# Patient Record
Sex: Female | Born: 1985 | Race: White | Hispanic: No | Marital: Married | Smoking: Current every day smoker
Health system: Southern US, Community
[De-identification: ages and names within clinical notes are randomized; demographics above are authoritative.]

## PROBLEM LIST (undated history)

## (undated) DIAGNOSIS — N200 Calculus of kidney: Secondary | ICD-10-CM

## (undated) DIAGNOSIS — J45909 Unspecified asthma, uncomplicated: Secondary | ICD-10-CM

## (undated) HISTORY — PX: BLADDER SURGERY: SHX569

## (undated) HISTORY — PX: ABDOMINAL HYSTERECTOMY: SHX81

## (undated) HISTORY — PX: BACK SURGERY: SHX140

---

## 2007-03-20 ENCOUNTER — Ambulatory Visit (HOSPITAL_COMMUNITY): Admission: RE | Admit: 2007-03-20 | Discharge: 2007-03-20 | Payer: Self-pay | Admitting: Obstetrics and Gynecology

## 2007-04-09 ENCOUNTER — Ambulatory Visit (HOSPITAL_COMMUNITY): Admission: RE | Admit: 2007-04-09 | Discharge: 2007-04-09 | Payer: Self-pay | Admitting: Obstetrics and Gynecology

## 2007-05-07 ENCOUNTER — Ambulatory Visit (HOSPITAL_COMMUNITY): Admission: RE | Admit: 2007-05-07 | Discharge: 2007-05-07 | Payer: Self-pay | Admitting: Obstetrics and Gynecology

## 2007-06-04 ENCOUNTER — Ambulatory Visit (HOSPITAL_COMMUNITY): Admission: RE | Admit: 2007-06-04 | Discharge: 2007-06-04 | Payer: Self-pay | Admitting: Obstetrics and Gynecology

## 2007-07-02 ENCOUNTER — Ambulatory Visit (HOSPITAL_COMMUNITY): Admission: RE | Admit: 2007-07-02 | Discharge: 2007-07-02 | Payer: Self-pay | Admitting: Obstetrics and Gynecology

## 2007-07-30 ENCOUNTER — Ambulatory Visit (HOSPITAL_COMMUNITY): Admission: RE | Admit: 2007-07-30 | Discharge: 2007-07-30 | Payer: Self-pay | Admitting: Obstetrics and Gynecology

## 2007-08-24 ENCOUNTER — Ambulatory Visit (HOSPITAL_COMMUNITY): Admission: RE | Admit: 2007-08-24 | Discharge: 2007-08-24 | Payer: Self-pay | Admitting: Obstetrics and Gynecology

## 2014-04-03 ENCOUNTER — Encounter (HOSPITAL_COMMUNITY): Payer: Self-pay | Admitting: Emergency Medicine

## 2014-04-03 ENCOUNTER — Emergency Department (HOSPITAL_COMMUNITY)
Admission: EM | Admit: 2014-04-03 | Discharge: 2014-04-04 | Disposition: A | Discharge status: Home / Self Care | Payer: Self-pay | Attending: Emergency Medicine | Admitting: Emergency Medicine

## 2014-04-03 DIAGNOSIS — N390 Urinary tract infection, site not specified: Secondary | ICD-10-CM | POA: Insufficient documentation

## 2014-04-03 DIAGNOSIS — J45909 Unspecified asthma, uncomplicated: Secondary | ICD-10-CM | POA: Insufficient documentation

## 2014-04-03 DIAGNOSIS — R1031 Right lower quadrant pain: Secondary | ICD-10-CM | POA: Insufficient documentation

## 2014-04-03 DIAGNOSIS — F17200 Nicotine dependence, unspecified, uncomplicated: Secondary | ICD-10-CM | POA: Insufficient documentation

## 2014-04-03 DIAGNOSIS — Z3202 Encounter for pregnancy test, result negative: Secondary | ICD-10-CM | POA: Insufficient documentation

## 2014-04-03 HISTORY — DX: Unspecified asthma, uncomplicated: J45.909

## 2014-04-03 LAB — URINALYSIS, ROUTINE W REFLEX MICROSCOPIC
Bilirubin Urine: NEGATIVE
Glucose, UA: NEGATIVE mg/dL
Hgb urine dipstick: NEGATIVE
Ketones, ur: NEGATIVE mg/dL
LEUKOCYTES UA: NEGATIVE
NITRITE: POSITIVE — AB
PROTEIN: NEGATIVE mg/dL
Specific Gravity, Urine: 1.015 (ref 1.005–1.030)
Urobilinogen, UA: 1 mg/dL (ref 0.0–1.0)
pH: 5.5 (ref 5.0–8.0)

## 2014-04-03 LAB — COMPREHENSIVE METABOLIC PANEL
ALBUMIN: 3.7 g/dL (ref 3.5–5.2)
ALK PHOS: 93 U/L (ref 39–117)
ALT: 9 U/L (ref 0–35)
AST: 13 U/L (ref 0–37)
Anion gap: 12 (ref 5–15)
BUN: 9 mg/dL (ref 6–23)
CHLORIDE: 106 meq/L (ref 96–112)
CO2: 24 mEq/L (ref 19–32)
Calcium: 8.9 mg/dL (ref 8.4–10.5)
Creatinine, Ser: 0.66 mg/dL (ref 0.50–1.10)
GLUCOSE: 96 mg/dL (ref 70–99)
POTASSIUM: 3.8 meq/L (ref 3.7–5.3)
Sodium: 142 mEq/L (ref 137–147)
TOTAL PROTEIN: 6.7 g/dL (ref 6.0–8.3)

## 2014-04-03 LAB — URINE MICROSCOPIC-ADD ON

## 2014-04-03 LAB — CBC WITH DIFFERENTIAL/PLATELET
BASOS PCT: 0 % (ref 0–1)
Basophils Absolute: 0 10*3/uL (ref 0.0–0.1)
EOS PCT: 3 % (ref 0–5)
Eosinophils Absolute: 0.3 10*3/uL (ref 0.0–0.7)
HCT: 36.3 % (ref 36.0–46.0)
Hemoglobin: 11.9 g/dL — ABNORMAL LOW (ref 12.0–15.0)
Lymphocytes Relative: 38 % (ref 12–46)
Lymphs Abs: 2.9 10*3/uL (ref 0.7–4.0)
MCH: 28.4 pg (ref 26.0–34.0)
MCHC: 32.8 g/dL (ref 30.0–36.0)
MCV: 86.6 fL (ref 78.0–100.0)
MONO ABS: 0.6 10*3/uL (ref 0.1–1.0)
Monocytes Relative: 8 % (ref 3–12)
Neutro Abs: 3.8 10*3/uL (ref 1.7–7.7)
Neutrophils Relative %: 51 % (ref 43–77)
PLATELETS: 217 10*3/uL (ref 150–400)
RBC: 4.19 MIL/uL (ref 3.87–5.11)
RDW: 13.2 % (ref 11.5–15.5)
WBC: 7.5 10*3/uL (ref 4.0–10.5)

## 2014-04-03 LAB — POC URINE PREG, ED: PREG TEST UR: NEGATIVE

## 2014-04-03 NOTE — ED Provider Notes (Signed)
CSN: [id number]     Arrival date & time 04/03/14  2107 History   First MD Initiated Contact with Patient 04/03/14 2354     Chief Complaint  Patient presents with  . Possible Pregnancy  . Abdominal Pain     (Consider location/radiation/quality/duration/timing/severity/associated sxs/prior Treatment) HPI Patient is a 28 yo woman who presents with complaints of intermittent RLQ pain x 2 months. She has been nauseated but, denies vomiting. No fever. No diarrhea. Normal BMs, + Dysuria. No vaginal discharge. Nothing makes sx worse or better. Patient was seen at [facility] a couple of weeks ago and had a CT abd/pelvis which, she was told was normal.   Pain is aching, nonradiating.    Past Medical History  Diagnosis Date  . Asthma    Past Surgical History  Procedure Laterality Date  . Back surgery     No family history on file. History  Substance Use Topics  . Smoking status: Current Every Day Smoker -- 0.50 packs/day  . Smokeless tobacco: Not on file  . Alcohol Use: No   OB History   Grav Para Term Preterm Abortions TAB SAB Ect Mult Living                 Review of Systems  10 point review of symptoms obtained and is negative with the exceptions of symptoms noted above     Allergies  Review of patient's allergies indicates no known allergies.  Home Medications   Prior to Admission medications   Not on File   BP 102/72  Pulse 74  Temp(Src) 98.1 F (36.7 C)  Resp 20  Ht 5\' 2"  (1.575 m)  Wt 140 lb (63.504 kg)  BMI 25.60 kg/m2  SpO2 100%  LMP 02/01/2014 Physical Exam  Gen: well nourished and well developed appearing Head: NCAT Ears: normal to inspection Nose: normal to inspection, no epistaxis or drainage Mouth: oral mucsoa is well hydrated appearing, normal posterior oropharynx Neck: supple, no stridor CV: RRR, no murmur, palpable peripheral pulses Resp: lung sounds are clear to auscultation bilaterally, no wheeing or rhonchi or rales, normal  respiratory effort.  Abd: soft, mild RLQ ttp, nondistended Extremities: normal to inspection.  Skin: warm and dry Neuro: CN ii - XII, no focal deficitis Psyche; normal affect, cooperative.   ED Course  Procedures (including critical care time) Labs Review  Results for orders placed during the hospital encounter of 04/03/14 (from the past 24 hour(s))  URINALYSIS, ROUTINE W REFLEX MICROSCOPIC     Status: Abnormal   Collection Time    04/03/14  9:54 PM      Result Value Ref Range   Color, Urine YELLOW  YELLOW   APPearance CLOUDY (*) CLEAR   Specific Gravity, Urine 1.015  1.005 - 1.030   pH 5.5  5.0 - 8.0   Glucose, UA NEGATIVE  NEGATIVE mg/dL   Hgb urine dipstick NEGATIVE  NEGATIVE   Bilirubin Urine NEGATIVE  NEGATIVE   Ketones, ur NEGATIVE  NEGATIVE mg/dL   Protein, ur NEGATIVE  NEGATIVE mg/dL   Urobilinogen, UA 1.0  0.0 - 1.0 mg/dL   Nitrite POSITIVE (*) NEGATIVE   Leukocytes, UA NEGATIVE  NEGATIVE  URINE MICROSCOPIC-ADD ON     Status: Abnormal   Collection Time    04/03/14  9:54 PM      Result Value Ref Range   Squamous Epithelial / LPF FEW (*) RARE   WBC, UA 3-6  <3 WBC/hpf   RBC / HPF 0-2  <  3 RBC/hpf   Bacteria, UA FEW (*) RARE   Urine-Other MUCOUS PRESENT    CBC WITH DIFFERENTIAL     Status: Abnormal   Collection Time    04/03/14 10:00 PM      Result Value Ref Range   WBC 7.5  4.0 - 10.5 K/uL   RBC 4.19  3.87 - 5.11 MIL/uL   Hemoglobin 11.9 (*) 12.0 - 15.0 g/dL   HCT 45.436.3  09.836.0 - 11.946.0 %   MCV 86.6  78.0 - 100.0 fL   MCH 28.4  26.0 - 34.0 pg   MCHC 32.8  30.0 - 36.0 g/dL   RDW 14.713.2  82.911.5 - 56.215.5 %   Platelets 217  150 - 400 K/uL   Neutrophils Relative % 51  43 - 77 %   Neutro Abs 3.8  1.7 - 7.7 K/uL   Lymphocytes Relative 38  12 - 46 %   Lymphs Abs 2.9  0.7 - 4.0 K/uL   Monocytes Relative 8  3 - 12 %   Monocytes Absolute 0.6  0.1 - 1.0 K/uL   Eosinophils Relative 3  0 - 5 %   Eosinophils Absolute 0.3  0.0 - 0.7 K/uL   Basophils Relative 0  0 - 1 %    Basophils Absolute 0.0  0.0 - 0.1 K/uL  COMPREHENSIVE METABOLIC PANEL     Status: Abnormal   Collection Time    04/03/14 10:00 PM      Result Value Ref Range   Sodium 142  137 - 147 mEq/L   Potassium 3.8  3.7 - 5.3 mEq/L   Chloride 106  96 - 112 mEq/L   CO2 24  19 - 32 mEq/L   Glucose, Bld 96  70 - 99 mg/dL   BUN 9  6 - 23 mg/dL   Creatinine, Ser 1.300.66  0.50 - 1.10 mg/dL   Calcium 8.9  8.4 - 86.510.5 mg/dL   Total Protein 6.7  6.0 - 8.3 g/dL   Albumin 3.7  3.5 - 5.2 g/dL   AST 13  0 - 37 U/L   ALT 9  0 - 35 U/L   Alkaline Phosphatase 93  39 - 117 U/L   Total Bilirubin <0.2 (*) 0.3 - 1.2 mg/dL   GFR calc non Af Amer >90  >90 mL/min   GFR calc Af Amer >90  >90 mL/min   Anion gap 12  5 - 15  POC URINE PREG, ED     Status: None   Collection Time    04/03/14 10:09 PM      Result Value Ref Range   Preg Test, Ur NEGATIVE  NEGATIVE     MDM   Patient noted to have UTI. Abdominal exam is reassuring. Normal VS. Normal WBC. We will tx empirically with Keflex. Patient is stable for d/c with plan for outpatient f/u.    Brandt LoosenJulie Manly, MD 04/04/14 [phone]

## 2014-04-03 NOTE — ED Notes (Signed)
Patient here with complaint of abdominal pain and states that she may be pregnant. States LMP was about 2 months ago. Endorses some dysuria. Denies fevers at home. Denies bleeding. States mild discharge.

## 2014-04-04 MED ORDER — ONDANSETRON 4 MG PO TBDP
4.0000 mg | ORAL_TABLET | Freq: Once | ORAL | Status: AC
  Administered 2014-04-04: 4 mg via ORAL
  Filled 2014-04-04: qty 1

## 2014-04-04 MED ORDER — TRAMADOL HCL 50 MG PO TABS: 50.0000 mg | ORAL_TABLET | Freq: Four times a day (QID) | ORAL | Status: DC | PRN

## 2014-04-04 MED ORDER — ONDANSETRON HCL 4 MG PO TABS: 4.0000 mg | ORAL_TABLET | Freq: Four times a day (QID) | ORAL | Status: DC

## 2014-04-04 MED ORDER — TRAMADOL HCL 50 MG PO TABS
50.0000 mg | ORAL_TABLET | Freq: Once | ORAL | Status: AC
  Administered 2014-04-04: 50 mg via ORAL
  Filled 2014-04-04: qty 1

## 2014-04-04 MED ORDER — CEPHALEXIN 500 MG PO CAPS: 500.0000 mg | ORAL_CAPSULE | Freq: Three times a day (TID) | ORAL | Status: DC

## 2015-08-03 ENCOUNTER — Emergency Department (HOSPITAL_BASED_OUTPATIENT_CLINIC_OR_DEPARTMENT_OTHER): Payer: Medicaid Other

## 2015-08-03 ENCOUNTER — Encounter (HOSPITAL_BASED_OUTPATIENT_CLINIC_OR_DEPARTMENT_OTHER): Payer: Self-pay | Admitting: *Deleted

## 2015-08-03 ENCOUNTER — Emergency Department (HOSPITAL_BASED_OUTPATIENT_CLINIC_OR_DEPARTMENT_OTHER)
Admission: EM | Admit: 2015-08-03 | Discharge: 2015-08-03 | Disposition: A | Discharge status: Home / Self Care | Payer: Medicaid Other | Attending: Emergency Medicine | Admitting: Emergency Medicine

## 2015-08-03 DIAGNOSIS — R319 Hematuria, unspecified: Secondary | ICD-10-CM | POA: Diagnosis not present

## 2015-08-03 DIAGNOSIS — Z880 Allergy status to penicillin: Secondary | ICD-10-CM | POA: Insufficient documentation

## 2015-08-03 DIAGNOSIS — Z9889 Other specified postprocedural states: Secondary | ICD-10-CM | POA: Diagnosis not present

## 2015-08-03 DIAGNOSIS — Z3202 Encounter for pregnancy test, result negative: Secondary | ICD-10-CM | POA: Insufficient documentation

## 2015-08-03 DIAGNOSIS — Z79899 Other long term (current) drug therapy: Secondary | ICD-10-CM | POA: Diagnosis not present

## 2015-08-03 DIAGNOSIS — F17200 Nicotine dependence, unspecified, uncomplicated: Secondary | ICD-10-CM | POA: Insufficient documentation

## 2015-08-03 DIAGNOSIS — R109 Unspecified abdominal pain: Secondary | ICD-10-CM

## 2015-08-03 DIAGNOSIS — Z792 Long term (current) use of antibiotics: Secondary | ICD-10-CM | POA: Diagnosis not present

## 2015-08-03 DIAGNOSIS — R10811 Right upper quadrant abdominal tenderness: Secondary | ICD-10-CM | POA: Diagnosis not present

## 2015-08-03 DIAGNOSIS — J45901 Unspecified asthma with (acute) exacerbation: Secondary | ICD-10-CM | POA: Diagnosis not present

## 2015-08-03 DIAGNOSIS — R1030 Lower abdominal pain, unspecified: Secondary | ICD-10-CM | POA: Diagnosis not present

## 2015-08-03 DIAGNOSIS — R300 Dysuria: Secondary | ICD-10-CM | POA: Diagnosis not present

## 2015-08-03 DIAGNOSIS — R05 Cough: Secondary | ICD-10-CM | POA: Diagnosis present

## 2015-08-03 LAB — URINALYSIS, ROUTINE W REFLEX MICROSCOPIC
BILIRUBIN URINE: NEGATIVE
Glucose, UA: NEGATIVE mg/dL
Hgb urine dipstick: NEGATIVE
KETONES UR: NEGATIVE mg/dL
LEUKOCYTES UA: NEGATIVE
NITRITE: NEGATIVE
PH: 7 (ref 5.0–8.0)
PROTEIN: NEGATIVE mg/dL
Specific Gravity, Urine: 1.006 (ref 1.005–1.030)

## 2015-08-03 LAB — COMPREHENSIVE METABOLIC PANEL
ALBUMIN: 4.1 g/dL (ref 3.5–5.0)
ALT: 19 U/L (ref 14–54)
AST: 23 U/L (ref 15–41)
Alkaline Phosphatase: 79 U/L (ref 38–126)
Anion gap: 8 (ref 5–15)
BUN: 6 mg/dL (ref 6–20)
CHLORIDE: 106 mmol/L (ref 101–111)
CO2: 26 mmol/L (ref 22–32)
CREATININE: 0.59 mg/dL (ref 0.44–1.00)
Calcium: 9.2 mg/dL (ref 8.9–10.3)
GFR calc Af Amer: >60 mL/min (ref >60)
GFR calc non Af Amer: >60 mL/min (ref >60)
Glucose, Bld: 113 mg/dL — ABNORMAL HIGH (ref 65–99)
Potassium: 3.4 mmol/L — ABNORMAL LOW (ref 3.5–5.1)
SODIUM: 140 mmol/L (ref 135–145)
Total Bilirubin: 0.3 mg/dL (ref 0.3–1.2)
Total Protein: 6.9 g/dL (ref 6.5–8.1)

## 2015-08-03 LAB — CBC WITH DIFFERENTIAL/PLATELET
Basophils Absolute: 0 10*3/uL (ref 0.0–0.1)
Basophils Relative: 0 %
EOS PCT: 3 %
Eosinophils Absolute: 0.2 10*3/uL (ref 0.0–0.7)
HCT: 38.9 % (ref 36.0–46.0)
Hemoglobin: 12.6 g/dL (ref 12.0–15.0)
LYMPHS ABS: 2.5 10*3/uL (ref 0.7–4.0)
LYMPHS PCT: 32 %
MCH: 27.6 pg (ref 26.0–34.0)
MCHC: 32.4 g/dL (ref 30.0–36.0)
MCV: 85.1 fL (ref 78.0–100.0)
Monocytes Absolute: 0.6 10*3/uL (ref 0.1–1.0)
Monocytes Relative: 7 %
Neutro Abs: 4.6 10*3/uL (ref 1.7–7.7)
Neutrophils Relative %: 58 %
PLATELETS: 216 10*3/uL (ref 150–400)
RBC: 4.57 MIL/uL (ref 3.87–5.11)
RDW: 12.8 % (ref 11.5–15.5)
WBC: 7.8 10*3/uL (ref 4.0–10.5)

## 2015-08-03 LAB — LIPASE, BLOOD: Lipase: 25 U/L (ref 11–51)

## 2015-08-03 LAB — PREGNANCY, URINE: Preg Test, Ur: NEGATIVE

## 2015-08-03 MED ORDER — ALBUTEROL SULFATE (2.5 MG/3ML) 0.083% IN NEBU
2.5000 mg | INHALATION_SOLUTION | Freq: Once | RESPIRATORY_TRACT | Status: AC
  Administered 2015-08-03: 2.5 mg via RESPIRATORY_TRACT

## 2015-08-03 MED ORDER — IPRATROPIUM-ALBUTEROL 0.5-2.5 (3) MG/3ML IN SOLN
3.0000 mL | Freq: Once | RESPIRATORY_TRACT | Status: AC
  Administered 2015-08-03: 3 mL via RESPIRATORY_TRACT

## 2015-08-03 MED ORDER — ALBUTEROL SULFATE (2.5 MG/3ML) 0.083% IN NEBU
INHALATION_SOLUTION | RESPIRATORY_TRACT | Status: AC
  Filled 2015-08-03: qty 3

## 2015-08-03 MED ORDER — PREDNISONE 50 MG PO TABS
60.0000 mg | ORAL_TABLET | Freq: Once | ORAL | Status: AC
  Administered 2015-08-03: 60 mg via ORAL
  Filled 2015-08-03: qty 1

## 2015-08-03 MED ORDER — ACETAMINOPHEN 500 MG PO TABS
1000.0000 mg | ORAL_TABLET | Freq: Once | ORAL | Status: AC
  Administered 2015-08-03: 1000 mg via ORAL
  Filled 2015-08-03: qty 2

## 2015-08-03 MED ORDER — PREDNISONE 20 MG PO TABS: 40.0000 mg | ORAL_TABLET | Freq: Every day | ORAL | Status: AC

## 2015-08-03 MED ORDER — IPRATROPIUM-ALBUTEROL 0.5-2.5 (3) MG/3ML IN SOLN
RESPIRATORY_TRACT | Status: AC
  Filled 2015-08-03: qty 3

## 2015-08-03 NOTE — ED Notes (Signed)
Hx of asthma. Cough for 2 weeks. States she thinks this asthma related.

## 2015-08-03 NOTE — Discharge Instructions (Signed)
Use your albuterol inhaler 2 puffs every 4 hours for the next 48 hours. If you are needing to use it more than this for shortness of breath return to the ER or your primary doctor for evaluation. Start the steroids tomorrow and take them to completion. Follow-up with her primary doctor for your abdominal pain.

## 2015-08-03 NOTE — ED Provider Notes (Signed)
CSN: [id number]     Arrival date & time 08/03/15  1341 History   First MD Initiated Contact with Patient 08/03/15 1354     Chief Complaint  Patient presents with  . Cough     (Consider location/radiation/quality/duration/timing/severity/associated sxs/prior Treatment) HPI  29 year old female with a history of asthma who also smokes presents with 2 weeks of cough that is nonproductive. Has also been having shortness of breath and wheezing. Has been using her albuterol inhaler but no real relief. Symptoms are worse at night. Denies any fevers. Over the last 1 week she has also had suprapubic abdominal pain with dysuria and one time had hematuria. She also did endorses burning in her right upper quadrant/right side for the past 1 month. Waxes and wanes. Does seem to worsen with eating. Denies any vaginal bleeding or discharge. She has had an abdominal hysterectomy. No vomiting.  Past Medical History  Diagnosis Date  . Asthma    Past Surgical History  Procedure Laterality Date  . Back surgery    . Abdominal hysterectomy     No family history on file. Social History  Substance Use Topics  . Smoking status: Current Every Day Smoker -- 0.50 packs/day  . Smokeless tobacco: None  . Alcohol Use: No   OB History    No data available     Review of Systems  Constitutional: Negative for fever.  HENT: Positive for congestion.   Respiratory: Positive for cough, shortness of breath and wheezing.   Gastrointestinal: Positive for abdominal pain. Negative for vomiting.  Genitourinary: Positive for dysuria and hematuria. Negative for vaginal bleeding, vaginal discharge and vaginal pain.  All other systems reviewed and are negative.     Allergies  Penicillins  Home Medications   Prior to Admission medications   Medication Sig Start Date End Date Taking? Authorizing Provider  ALBUTEROL IN Inhale into the lungs.   Yes Historical Provider, MD  cephALEXin (KEFLEX) 500 MG capsule Take 1  capsule (500 mg total) by mouth 3 (three) times daily. 04/04/14   Brandt Loosen, MD  cephALEXin (KEFLEX) 500 MG capsule Take 1 capsule (500 mg total) by mouth 3 (three) times daily. 04/04/14   Brandt Loosen, MD  ondansetron (ZOFRAN) 4 MG tablet Take 1 tablet (4 mg total) by mouth every 6 (six) hours. 04/04/14   Brandt Loosen, MD  ondansetron (ZOFRAN) 4 MG tablet Take 1 tablet (4 mg total) by mouth every 6 (six) hours. 04/04/14   Brandt Loosen, MD  traMADol (ULTRAM) 50 MG tablet Take 1 tablet (50 mg total) by mouth every 6 (six) hours as needed. 04/04/14   Brandt Loosen, MD  traMADol (ULTRAM) 50 MG tablet Take 1 tablet (50 mg total) by mouth every 6 (six) hours as needed. 04/04/14   Brandt Loosen, MD   BP 111/86 mmHg  Pulse 69  Temp(Src) 98.2 F (36.8 C) (Oral)  Resp 20  Ht  (1.575 m)  Wt 140 lb (63.504 kg)  BMI 25.60 kg/m2  SpO2 99%  LMP 02/01/2014 Physical Exam  Constitutional: She is oriented to person, place, and time. She appears well-developed and well-nourished.  HENT:  Head: Normocephalic and atraumatic.  Right Ear: External ear normal.  Left Ear: External ear normal.  Nose: Nose normal.  Eyes: Right eye exhibits no discharge. Left eye exhibits no discharge.  Cardiovascular: Normal rate, regular rhythm and normal heart sounds.   Pulmonary/Chest: Effort normal. She has wheezes (diffuse, expiratory).  Abdominal: Soft. She exhibits no distension. There is tenderness (  mild) in the right upper quadrant.  Neurological: She is alert and oriented to person, place, and time.  Skin: Skin is warm and dry.  Nursing note and vitals reviewed.   ED Course  Procedures (including critical care time) Labs Review Labs Reviewed  URINALYSIS, ROUTINE W REFLEX MICROSCOPIC (NOT AT [facility]) - Abnormal; Notable for the following:    APPearance CLOUDY (*)    All other components within normal limits  COMPREHENSIVE METABOLIC PANEL - Abnormal; Notable for the following:    Potassium 3.4 (*)    Glucose, Bld 113  (*)    All other components within normal limits  PREGNANCY, URINE  LIPASE, BLOOD  CBC WITH DIFFERENTIAL/PLATELET    Imaging Review Dg Chest 2 View  08/03/2015  CLINICAL DATA:  New cough and wheeze for 2 weeks. History of asthma. Current smoker. EXAM: CHEST  2 VIEW COMPARISON:  06/20/2015; 06/18/2014; 04/17/2014 FINDINGS: Grossly unchanged cardiac silhouette and mediastinal contours. There is mild diffuse slightly nodular thickening of the pulmonary interstitium. No focal airspace opacities. No pleural effusion or pneumothorax. No evidence of edema. No acute osseus abnormalities. IMPRESSION: Findings suggestive of airways disease / bronchitis. No focal airspace opacities to suggest pneumonia. Electronically Signed   By: Simonne ComeJohn  Watts M.D.   On: 08/03/2015 15:06   I have personally reviewed and evaluated these images and lab results as part of my medical decision-making.   EKG Interpretation None      MDM   Final diagnoses:  Asthma exacerbation    Patient has wheezing but has no increased work of breathing and is speaking in complete sentences. Patient will be treated with albuterol and I believe she will need steroids and continued albuterol at home. Chest x-ray shows no acute pneumonia. Her complaint of dysuria is of unclear etiology, no UTI noted. She declines pelvic testing or STD testing. She states she is only section active with her husband. She also complains of intermittent right upper quadrant burning and thus an ultrasound was obtained to rule out gallbladder pathology. Ultrasound shows old polyp but no obvious cause of her pain. Will refer to her PCP for her month long abd pain. No lower abd tenderness to suggest appendicitis or ovarian pathology    Pricilla LovelessScott Kemya Shed, MD 08/03/15 [phone]

## 2015-11-05 ENCOUNTER — Emergency Department (HOSPITAL_BASED_OUTPATIENT_CLINIC_OR_DEPARTMENT_OTHER): Payer: Medicaid Other

## 2015-11-05 ENCOUNTER — Emergency Department (HOSPITAL_BASED_OUTPATIENT_CLINIC_OR_DEPARTMENT_OTHER)
Admission: EM | Admit: 2015-11-05 | Discharge: 2015-11-06 | Disposition: A | Discharge status: Home / Self Care | Payer: Medicaid Other | Attending: Emergency Medicine | Admitting: Emergency Medicine

## 2015-11-05 ENCOUNTER — Encounter (HOSPITAL_BASED_OUTPATIENT_CLINIC_OR_DEPARTMENT_OTHER): Payer: Self-pay | Admitting: *Deleted

## 2015-11-05 DIAGNOSIS — F17200 Nicotine dependence, unspecified, uncomplicated: Secondary | ICD-10-CM | POA: Insufficient documentation

## 2015-11-05 DIAGNOSIS — Z79899 Other long term (current) drug therapy: Secondary | ICD-10-CM | POA: Insufficient documentation

## 2015-11-05 DIAGNOSIS — R1011 Right upper quadrant pain: Secondary | ICD-10-CM | POA: Diagnosis present

## 2015-11-05 DIAGNOSIS — Z880 Allergy status to penicillin: Secondary | ICD-10-CM | POA: Diagnosis not present

## 2015-11-05 DIAGNOSIS — J45909 Unspecified asthma, uncomplicated: Secondary | ICD-10-CM | POA: Diagnosis not present

## 2015-11-05 DIAGNOSIS — Z792 Long term (current) use of antibiotics: Secondary | ICD-10-CM | POA: Insufficient documentation

## 2015-11-05 DIAGNOSIS — K219 Gastro-esophageal reflux disease without esophagitis: Secondary | ICD-10-CM | POA: Diagnosis not present

## 2015-11-05 DIAGNOSIS — K5909 Other constipation: Secondary | ICD-10-CM | POA: Insufficient documentation

## 2015-11-05 LAB — URINALYSIS, ROUTINE W REFLEX MICROSCOPIC
Glucose, UA: NEGATIVE mg/dL
HGB URINE DIPSTICK: NEGATIVE
Ketones, ur: NEGATIVE mg/dL
Leukocytes, UA: NEGATIVE
Nitrite: NEGATIVE
PROTEIN: NEGATIVE mg/dL
Specific Gravity, Urine: 1.023 (ref 1.005–1.030)
pH: 6 (ref 5.0–8.0)

## 2015-11-05 NOTE — ED Notes (Signed)
C/o RUQ pain, onset 4d ago, also nausea, vomited x2, and diarrhea with eating (loose), sx worse after eating (denies: bleeding, fever, urinary sx or vaginal sx). No meds taken PTA. Last ate at 1930. Last BM 1900.

## 2015-11-05 NOTE — ED Provider Notes (Signed)
CSN: [id number]     Arrival date & time 11/05/15  2049 History  By signing my name below, I, Priscilla Barajas, attest that this documentation has been prepared under the direction and in the presence of Priscilla Achord, MD. Electronically Signed: Budd Barajas, ED Scribe. 11/05/2015. 12:24 AM.     Chief Complaint  Patient presents with  . Abdominal Pain   Patient is a 30 y.o. female presenting with abdominal pain. The history is provided by the patient. No language interpreter was used.  Abdominal Pain Pain location:  RUQ and epigastric Pain quality: cramping   Pain radiates to:  RLQ Pain severity:  Moderate Onset quality:  Gradual Duration:  5 days Timing:  Constant Progression:  Waxing and waning Chronicity:  New Context: eating   Context: not laxative use   Relieved by:  Nothing Worsened by:  Eating Ineffective treatments:  None tried Associated symptoms: nausea   Associated symptoms: no chest pain, no constipation, no diarrhea and no vomiting   Nausea:    Severity:  Mild   Onset quality:  Gradual   Timing:  Constant   Progression:  Unchanged Risk factors: no alcohol abuse and not pregnant    HPI Comments: Priscilla Barajas is a 30 y.o. female smoker at 0.5 ppd who presents to the Emergency Department complaining of RUQ abdominal pain radiating downward onset 4 days ago. She reports associated nausea. She notes exacerbation of the pain after eating. She states she has not eaten anything unusual. She states she ate a pasta dish from [employer] today. She denies being on any antacids. She states her most recent BM was tonight at 8 PM and was soft. She notes a PSHx of partial hysterectomy.    Past Medical History  Diagnosis Date  . Asthma    Past Surgical History  Procedure Laterality Date  . Back surgery    . Abdominal hysterectomy    . Bladder surgery     History reviewed. No pertinent family history. Social History  Substance Use Topics  . Smoking status: Current Every  Day Smoker -- 0.50 packs/day  . Smokeless tobacco: None  . Alcohol Use: No   OB History    No data available     Review of Systems  Cardiovascular: Negative for chest pain, palpitations and leg swelling.  Gastrointestinal: Positive for nausea and abdominal pain. Negative for vomiting, diarrhea, constipation and rectal pain.  All other systems reviewed and are negative.   Allergies  Penicillins  Home Medications   Prior to Admission medications   Medication Sig Start Date End Date Taking? Authorizing Provider  ALBUTEROL IN Inhale into the lungs.    Historical Provider, MD  cephALEXin (KEFLEX) 500 MG capsule Take 1 capsule (500 mg total) by mouth 3 (three) times daily. 04/04/14   Brandt Loosen, MD  cephALEXin (KEFLEX) 500 MG capsule Take 1 capsule (500 mg total) by mouth 3 (three) times daily. 04/04/14   Brandt Loosen, MD  ondansetron (ZOFRAN) 4 MG tablet Take 1 tablet (4 mg total) by mouth every 6 (six) hours. 04/04/14   Brandt Loosen, MD  ondansetron (ZOFRAN) 4 MG tablet Take 1 tablet (4 mg total) by mouth every 6 (six) hours. 04/04/14   Brandt Loosen, MD  traMADol (ULTRAM) 50 MG tablet Take 1 tablet (50 mg total) by mouth every 6 (six) hours as needed. 04/04/14   Brandt Loosen, MD  traMADol (ULTRAM) 50 MG tablet Take 1 tablet (50 mg total) by mouth every 6 (six) hours as  needed. 04/04/14   Brandt LoosenJulie Manly, MD   BP 100/58 mmHg  Pulse 69  Temp(Src) 98.7 F (37.1 C) (Oral)  Resp 18  Ht 5\' 2"  (1.575 m)  Wt 140 lb (63.504 kg)  BMI 25.60 kg/m2  SpO2 99%  LMP 02/01/2014 Physical Exam  Constitutional: She is oriented to person, place, and time. She appears well-developed and well-nourished.  HENT:  Head: Normocephalic and atraumatic.  Mouth/Throat: Oropharynx is clear and moist. No oropharyngeal exudate.  Eyes: Conjunctivae are normal. Right eye exhibits no discharge. Left eye exhibits no discharge.  Neck:  No bruits  Cardiovascular: Normal rate, regular rhythm and normal heart sounds.    Pulmonary/Chest: Effort normal and breath sounds normal. No stridor. No respiratory distress. She has no wheezes. She has no rales.  Abdominal: Soft. There is no tenderness. There is no rigidity, no rebound, no guarding, no tenderness at McBurney's point and negative Murphy's sign.  Gassy, palpable stool  Neurological: She is alert and oriented to person, place, and time. Coordination normal.  Skin: Skin is warm and dry. No rash noted. She is not diaphoretic. No erythema.  Psychiatric: She has a normal mood and affect.  Nursing note and vitals reviewed.   ED Course  Procedures  DIAGNOSTIC STUDIES: Oxygen Saturation is 99% on RA, normal by my interpretation.    COORDINATION OF CARE: 11:59 PM - Discussed probable GERD plans to wait on diagnostic imaging. Parent advised of plan for treatment and parent agrees.  Labs Review Labs Reviewed  URINALYSIS, ROUTINE W REFLEX MICROSCOPIC (NOT AT [facility]) - Abnormal; Notable for the following:    Bilirubin Urine SMALL (*)    All other components within normal limits  CBC WITH DIFFERENTIAL/PLATELET  COMPREHENSIVE METABOLIC PANEL    Imaging Review No results found. I have personally reviewed and evaluated these images and lab results as part of my medical decision-making.   EKG Interpretation None      MDM   Final diagnoses:  None    Symptoms consistent with GERD with superimposed constipation.  The symptoms, labs and exam do not favor biliary colic.  Will start GERD friendly diet, PPI and miralax BID x 7 days then change to miralax daily from there on out.  Follow up with your family doctor for recheck in 3 days.  Strict abdominal pain return precautions given.    I personally performed the services described in this documentation, which was scribed in my presence. The recorded information has been reviewed and is accurate.     Priscilla BlamerApril Tiane Szydlowski, MD 11/06/15 [phone]

## 2015-11-06 ENCOUNTER — Encounter (HOSPITAL_BASED_OUTPATIENT_CLINIC_OR_DEPARTMENT_OTHER): Payer: Self-pay | Admitting: Emergency Medicine

## 2015-11-06 LAB — COMPREHENSIVE METABOLIC PANEL
ALBUMIN: 3.9 g/dL (ref 3.5–5.0)
ALK PHOS: 67 U/L (ref 38–126)
ALT: 18 U/L (ref 14–54)
ANION GAP: 8 (ref 5–15)
AST: 18 U/L (ref 15–41)
BUN: 14 mg/dL (ref 6–20)
CALCIUM: 8.5 mg/dL — AB (ref 8.9–10.3)
CHLORIDE: 105 mmol/L (ref 101–111)
CO2: 24 mmol/L (ref 22–32)
Creatinine, Ser: 0.69 mg/dL (ref 0.44–1.00)
GFR calc non Af Amer: >60 mL/min (ref >60)
GLUCOSE: 116 mg/dL — AB (ref 65–99)
Potassium: 3.5 mmol/L (ref 3.5–5.1)
SODIUM: 137 mmol/L (ref 135–145)
Total Bilirubin: 0.3 mg/dL (ref 0.3–1.2)
Total Protein: 6.7 g/dL (ref 6.5–8.1)

## 2015-11-06 LAB — CBC WITH DIFFERENTIAL/PLATELET
BASOS PCT: 0 %
Basophils Absolute: 0 10*3/uL (ref 0.0–0.1)
EOS ABS: 0.4 10*3/uL (ref 0.0–0.7)
EOS PCT: 3 %
HCT: 35.5 % — ABNORMAL LOW (ref 36.0–46.0)
HEMOGLOBIN: 11.8 g/dL — AB (ref 12.0–15.0)
Lymphocytes Relative: 32 %
Lymphs Abs: 3.8 10*3/uL (ref 0.7–4.0)
MCH: 28.6 pg (ref 26.0–34.0)
MCHC: 33.2 g/dL (ref 30.0–36.0)
MCV: 86.2 fL (ref 78.0–100.0)
Monocytes Absolute: 0.9 10*3/uL (ref 0.1–1.0)
Monocytes Relative: 8 %
NEUTROS PCT: 57 %
Neutro Abs: 6.6 10*3/uL (ref 1.7–7.7)
Platelets: 214 10*3/uL (ref 150–400)
RBC: 4.12 MIL/uL (ref 3.87–5.11)
RDW: 13.5 % (ref 11.5–15.5)
WBC: 11.7 10*3/uL — AB (ref 4.0–10.5)

## 2015-11-06 MED ORDER — POLYETHYLENE GLYCOL 3350 17 GM/SCOOP PO POWD: 17.0000 g | Freq: Every day | ORAL | Status: DC

## 2015-11-06 MED ORDER — GI COCKTAIL ~~LOC~~
30.0000 mL | Freq: Once | ORAL | Status: AC
  Administered 2015-11-06: 30 mL via ORAL
  Filled 2015-11-06: qty 30

## 2015-11-06 MED ORDER — DICYCLOMINE HCL 10 MG/ML IM SOLN
20.0000 mg | Freq: Once | INTRAMUSCULAR | Status: AC
  Administered 2015-11-06: 20 mg via INTRAMUSCULAR
  Filled 2015-11-06: qty 2

## 2015-11-06 MED ORDER — KETOROLAC TROMETHAMINE 30 MG/ML IJ SOLN
INTRAMUSCULAR | Status: AC
  Administered 2015-11-06: 60 mg
  Filled 2015-11-06: qty 2

## 2015-11-06 MED ORDER — ONDANSETRON 8 MG PO TBDP
8.0000 mg | ORAL_TABLET | Freq: Once | ORAL | Status: AC
  Administered 2015-11-06: 8 mg via ORAL
  Filled 2015-11-06: qty 1

## 2015-11-06 MED ORDER — KETOROLAC TROMETHAMINE 60 MG/2ML IM SOLN: 60.0000 mg | Freq: Once | INTRAMUSCULAR | Status: DC

## 2015-11-06 MED ORDER — OMEPRAZOLE 20 MG PO CPDR: 20.0000 mg | DELAYED_RELEASE_CAPSULE | Freq: Every day | ORAL | Status: AC

## 2015-11-06 NOTE — Discharge Instructions (Signed)

## 2017-10-24 ENCOUNTER — Emergency Department (HOSPITAL_BASED_OUTPATIENT_CLINIC_OR_DEPARTMENT_OTHER): Payer: Medicaid Other

## 2017-10-24 ENCOUNTER — Emergency Department (HOSPITAL_BASED_OUTPATIENT_CLINIC_OR_DEPARTMENT_OTHER)
Admission: EM | Admit: 2017-10-24 | Discharge: 2017-10-24 | Disposition: A | Discharge status: Home / Self Care | Payer: Medicaid Other | Attending: Emergency Medicine | Admitting: Emergency Medicine

## 2017-10-24 ENCOUNTER — Other Ambulatory Visit: Payer: Self-pay

## 2017-10-24 ENCOUNTER — Encounter (HOSPITAL_BASED_OUTPATIENT_CLINIC_OR_DEPARTMENT_OTHER): Payer: Self-pay | Admitting: *Deleted

## 2017-10-24 DIAGNOSIS — Z9889 Other specified postprocedural states: Secondary | ICD-10-CM | POA: Diagnosis not present

## 2017-10-24 DIAGNOSIS — J988 Other specified respiratory disorders: Secondary | ICD-10-CM

## 2017-10-24 DIAGNOSIS — Z79899 Other long term (current) drug therapy: Secondary | ICD-10-CM | POA: Insufficient documentation

## 2017-10-24 DIAGNOSIS — R509 Fever, unspecified: Secondary | ICD-10-CM | POA: Diagnosis present

## 2017-10-24 DIAGNOSIS — J069 Acute upper respiratory infection, unspecified: Secondary | ICD-10-CM | POA: Diagnosis not present

## 2017-10-24 DIAGNOSIS — J45909 Unspecified asthma, uncomplicated: Secondary | ICD-10-CM | POA: Insufficient documentation

## 2017-10-24 DIAGNOSIS — F17210 Nicotine dependence, cigarettes, uncomplicated: Secondary | ICD-10-CM | POA: Diagnosis not present

## 2017-10-24 DIAGNOSIS — B9789 Other viral agents as the cause of diseases classified elsewhere: Secondary | ICD-10-CM

## 2017-10-24 LAB — URINALYSIS, ROUTINE W REFLEX MICROSCOPIC
Bilirubin Urine: NEGATIVE
GLUCOSE, UA: NEGATIVE mg/dL
Ketones, ur: NEGATIVE mg/dL
Nitrite: NEGATIVE
PROTEIN: NEGATIVE mg/dL
Specific Gravity, Urine: 1.01 (ref 1.005–1.030)
pH: 6 (ref 5.0–8.0)

## 2017-10-24 LAB — URINALYSIS, MICROSCOPIC (REFLEX)

## 2017-10-24 MED ORDER — PREDNISONE 10 MG PO TABS: 40.0000 mg | ORAL_TABLET | Freq: Every day | ORAL | 0 refills | Status: AC

## 2017-10-24 MED ORDER — ALBUTEROL SULFATE HFA 108 (90 BASE) MCG/ACT IN AERS
2.0000 | INHALATION_SPRAY | RESPIRATORY_TRACT | Status: DC
  Administered 2017-10-24: 2 via RESPIRATORY_TRACT
  Filled 2017-10-24: qty 6.7

## 2017-10-24 MED ORDER — BENZONATATE 100 MG PO CAPS: 100.0000 mg | ORAL_CAPSULE | Freq: Three times a day (TID) | ORAL | 0 refills | Status: DC

## 2017-10-24 MED ORDER — PREDNISONE 50 MG PO TABS
60.0000 mg | ORAL_TABLET | Freq: Once | ORAL | Status: AC
  Administered 2017-10-24: 60 mg via ORAL
  Filled 2017-10-24: qty 1

## 2017-10-24 MED ORDER — IPRATROPIUM-ALBUTEROL 0.5-2.5 (3) MG/3ML IN SOLN
3.0000 mL | Freq: Once | RESPIRATORY_TRACT | Status: AC
  Administered 2017-10-24: 3 mL via RESPIRATORY_TRACT
  Filled 2017-10-24: qty 3

## 2017-10-24 NOTE — ED Triage Notes (Signed)
She had bladder surgery 2 days ago. She has had a fever since last night.

## 2017-10-24 NOTE — ED Provider Notes (Signed)
[facility] EMERGENCY DEPARTMENT Provider Note   CSN: [id number] Arrival date & time: 10/24/17  1733     History   Chief Complaint Chief Complaint  Patient presents with  . Post Surgical Fever    HPI Priscilla Barajas is a 32 y.o. female with a history of asthma who presents the emergency department today for subjective fever, body aches, nasal congestion, sore throat that has been ongoing for 1 week.  Patient notes that her symptoms began with body aches, nasal congestion, sore throat and a nonproductive cough.  She recently had repair of a removal of mesh from prior hysterectomy procedure done at [facility] by Dr. Cleatrice Burke. She notes that this went well and she has been recovering as expected. Patient did not require intubation for the procuedure. She was started her on levaquin but is unsure why but has been taking as prescribed for the last 2 days.  She reports yesterday that she started developing subjective fever and increase in her cough that is now productive.  She also reports some wheezing that developed today which is typical of her asthma exacerbations.  She denies any associated chest pain or chest tightness.  She has not taken anything for symptoms prior to arrival including antipyretics. She denies any rash, neck pain, don aphasia, dysphagia, shortness of breath, inability to control secretions, hemoptysis, lower leg swelling, abdominal pain, nausea, vomiting, diarrhea, vaginal discharge or vaginal bleeding.  She is a daily smoker.  She has never required admission or intubation for her asthma.  HPI  Past Medical History:  Diagnosis Date  . Asthma     There are no active problems to display for this patient.   Past Surgical History:  Procedure Laterality Date  . ABDOMINAL HYSTERECTOMY    . BACK SURGERY    . BLADDER SURGERY      OB History    No data available       Home Medications    Prior to Admission medications   Medication Sig Start  Date End Date Taking? Authorizing Provider  LevoFLOXacin (LEVAQUIN PO) Take by mouth.   Yes [provider]  OXYCODONE HCL PO Take by mouth.   Yes [provider]  ALBUTEROL IN Inhale into the lungs.    [provider]  cephALEXin (KEFLEX) 500 MG capsule Take 1 capsule (500 mg total) by mouth 3 (three) times daily. 04/04/14   Brandt Loosen, MD  cephALEXin (KEFLEX) 500 MG capsule Take 1 capsule (500 mg total) by mouth 3 (three) times daily. 04/04/14   Brandt Loosen, MD  omeprazole (PRILOSEC) 20 MG capsule Take 1 capsule (20 mg total) by mouth daily. 11/06/15   Palumbo, April, MD  ondansetron (ZOFRAN) 4 MG tablet Take 1 tablet (4 mg total) by mouth every 6 (six) hours. 04/04/14   Brandt Loosen, MD  ondansetron (ZOFRAN) 4 MG tablet Take 1 tablet (4 mg total) by mouth every 6 (six) hours. 04/04/14   Brandt Loosen, MD  polyethylene glycol powder (MIRALAX) powder Take 17 g by mouth daily. 11/06/15   Palumbo, April, MD  traMADol (ULTRAM) 50 MG tablet Take 1 tablet (50 mg total) by mouth every 6 (six) hours as needed. 04/04/14   Brandt Loosen, MD  traMADol (ULTRAM) 50 MG tablet Take 1 tablet (50 mg total) by mouth every 6 (six) hours as needed. 04/04/14   Brandt Loosen, MD    Family History No family history on file.  Social History Social History   Tobacco Use  .  Smoking status: Current Every Day Smoker    Packs/day: 0.50  . Smokeless tobacco: Never Used  Substance Use Topics  . Alcohol use: No  . Drug use: No     Allergies   Penicillins   Review of Systems Review of Systems  All other systems reviewed and are negative.    Physical Exam Updated Vital Signs BP 116/83   Pulse 69   Temp 97.9 F (36.6 C) (Oral)   Resp 20   Ht 5\' 2"  (1.575 m)   Wt 61.2 kg (135 lb)   LMP 02/01/2014   SpO2 99%   BMI 24.69 kg/m   Physical Exam  Constitutional: She appears well-developed and well-nourished.  HENT:  Head: Normocephalic and atraumatic.  Right Ear: Tympanic  membrane and external ear normal.  Left Ear: Tympanic membrane and external ear normal.  Nose: Mucosal edema present. Right sinus exhibits no maxillary sinus tenderness and no frontal sinus tenderness. Left sinus exhibits no maxillary sinus tenderness and no frontal sinus tenderness.  Mouth/Throat: Uvula is midline, oropharynx is clear and moist and mucous membranes are normal. No tonsillar exudate.  The patient has normal phonation and is in control of secretions. No stridor.  Midline uvula without edema. Soft palate rises symmetrically.  No tonsillar erythema or exudates. No PTA. Tongue protrusion is normal. No trismus. No creptius on neck palpation and patient has good dentition. No gingival erythema or fluctuance noted. Mucus membranes moist.   Eyes: Pupils are equal, round, and reactive to light. Right eye exhibits no discharge. Left eye exhibits no discharge. No scleral icterus.  Neck: Trachea normal. Neck supple. No spinous process tenderness present. No neck rigidity. Normal range of motion present.  No nuchal rigidity or meningismus  Cardiovascular: Normal rate, regular rhythm and intact distal pulses.  No murmur heard. Pulses:      Radial pulses are 2+ on the right side, and 2+ on the left side.       Dorsalis pedis pulses are 2+ on the right side, and 2+ on the left side.       Posterior tibial pulses are 2+ on the right side, and 2+ on the left side.  No lower extremity swelling or edema. Calves symmetric in size bilaterally.  Pulmonary/Chest: Effort normal. She has wheezes (throughout). She has rhonchi. She exhibits no tenderness.  No increased work of breathing. No accessory muscle use. Patient is sitting upright, speaking in full sentences without difficulty   Abdominal: Soft. Bowel sounds are normal. She exhibits no distension. There is no tenderness. There is no rigidity, no rebound, no guarding and no CVA tenderness.  Musculoskeletal: She exhibits no edema.  Lymphadenopathy:     She has no cervical adenopathy.  Neurological: She is alert.  Skin: Skin is warm and dry. No rash noted. She is not diaphoretic.  Psychiatric: She has a normal mood and affect.  Nursing note and vitals reviewed.    ED Treatments / Results  Labs (all labs ordered are listed, but only abnormal results are displayed) Labs Reviewed - No data to display  EKG  EKG Interpretation None       Radiology Dg Chest 2 View  Result Date: 10/24/2017 CLINICAL DATA:  Cough, wheeze, and fever. Bladder surgery 2 days ago. EXAM: CHEST - 2 VIEW COMPARISON:  11/05/2015 FINDINGS: Normal heart size and mediastinal contours. No acute infiltrate or edema. There is chronic minimal scarring in the left lung. No effusion or pneumothorax. Hypoplastic right first rib. Lumbar spine degeneration with  changes of laminectomy. No acute osseous findings. Cholecystectomy clips. IMPRESSION: No active cardiopulmonary disease. Electronically Signed   By: Marnee Spring M.D.   On: 10/24/2017 18:51    Procedures Procedures (including critical care time)  Medications Ordered in ED Medications  albuterol (PROVENTIL HFA;VENTOLIN HFA) 108 (90 Base) MCG/ACT inhaler 2 puff (2 puffs Inhalation Given 10/24/17 1932)  predniSONE (DELTASONE) tablet 60 mg (60 mg Oral Given 10/24/17 1847)  ipratropium-albuterol (DUONEB) 0.5-2.5 (3) MG/3ML nebulizer solution 3 mL (3 mLs Nebulization Given 10/24/17 1917)     Initial Impression / Assessment and Plan / ED Course  I have reviewed the triage vital signs and the nursing notes.  Pertinent labs & imaging results that were available during my care of the patient were reviewed by me and considered in my medical decision making (see chart for details).     32 year old female with a history of asthma, who recently underwent mesh removal from prior hysterectomy by Dr. Cleatrice Burke, who presents to the ED today for several days of URI symptoms.  Patient had the symptoms prior to procedure and was  started on Levaquin which she has been taking for the last 2 days. Patient is not complaining of sob, cp, or chest tightness but does not some wheezing she feels is typical of her asthma excerebration.  Patient is a daily smoker.  Patient is not complaining of any abdominal pain. No GI or GU symptoms.   Vital signs are reassuring. The patient is without fever and did not take antipyretics prior to arrival.  She is without tachycardia, tachypnea, hypoxia or hypotension.  Patient is in no respiratory distress. Patient ear exam without evidence of AOM. No meningeal signs  Oropharynx is clear. Do not suspect strep.  No rash.  Lungs with diffuse rhonchi and wheezing. Will obtain chest xray and give steroids and duoneb tx. Patient does not have inhaler at home. Will provide.   Abdomen is soft, nondistended and without tenderness. Do not suspect intra-abdominal pathology. Patient without reported urinary symptoms that would make me believe UTI is what is the source of the patient's fever. UA done in triage show contaminant and unlikely to be UTI. Will cx.   X-ray without any active cardiopulmonary disease.  There is no pleural effusions, or infiltrates noted.  After breathing treatment the patient feels symptoms have improved.  She is sating at greater than 95% on room air.  No chest pain, shortness of breath or chest tightness.  Suspect patient's symptoms are related to URI and not related to surgery.  She is already on Levaquin treatment that would cover for PNA. I advised the patient to follow-up with PCP and urologist this week. Specific return precautions discussed. Time was given for all questions to be answered. The patient verbalized understanding and agreement with plan. The patient appears safe for discharge home.  Final Clinical Impressions(s) / ED Diagnoses   Final diagnoses:  Viral URI with cough  Wheezing-associated respiratory infection (WARI)    ED Discharge Orders        Ordered     predniSONE (DELTASONE) 10 MG tablet  Daily with breakfast     10/24/17 1948    benzonatate (TESSALON) 100 MG capsule  Every 8 hours     10/24/17 1949       Princella Pellegrini 10/24/17 1949    Nira Conn, MD 10/24/17 [phone]

## 2017-10-24 NOTE — Discharge Instructions (Signed)
Please see attached handout. Your x-ray was reassuring in the department today. Please take prednisone as prescribed. Continue taking Levaquin. Albuterol inhaler - this medication will help open up your airway. Use inhaler as follows: 1-2 puffs with spacer every 4 hours as needed for wheezing, cough, or shortness of breath.  Take Tylenol or Advil as needed for fever and body aches  Please follow with your primary care doctor in the next 2 days for follow-up.  If you develop any abdominal pain, vaginal discharge, vaginal pain, vaginal bleeding or urinary symptoms please return for evaluation.

## 2017-10-26 LAB — URINE CULTURE: CULTURE: NO GROWTH

## 2018-04-20 ENCOUNTER — Encounter (HOSPITAL_BASED_OUTPATIENT_CLINIC_OR_DEPARTMENT_OTHER): Payer: Self-pay | Admitting: *Deleted

## 2018-04-20 ENCOUNTER — Other Ambulatory Visit: Payer: Self-pay

## 2018-04-20 ENCOUNTER — Emergency Department (HOSPITAL_BASED_OUTPATIENT_CLINIC_OR_DEPARTMENT_OTHER)
Admission: EM | Admit: 2018-04-20 | Discharge: 2018-04-20 | Disposition: A | Discharge status: Home / Self Care | Payer: Medicaid Other | Attending: Emergency Medicine | Admitting: Emergency Medicine

## 2018-04-20 DIAGNOSIS — J4521 Mild intermittent asthma with (acute) exacerbation: Secondary | ICD-10-CM | POA: Diagnosis not present

## 2018-04-20 DIAGNOSIS — Z79899 Other long term (current) drug therapy: Secondary | ICD-10-CM | POA: Insufficient documentation

## 2018-04-20 DIAGNOSIS — J069 Acute upper respiratory infection, unspecified: Secondary | ICD-10-CM

## 2018-04-20 DIAGNOSIS — J45901 Unspecified asthma with (acute) exacerbation: Secondary | ICD-10-CM

## 2018-04-20 DIAGNOSIS — R05 Cough: Secondary | ICD-10-CM | POA: Diagnosis present

## 2018-04-20 DIAGNOSIS — F17200 Nicotine dependence, unspecified, uncomplicated: Secondary | ICD-10-CM | POA: Insufficient documentation

## 2018-04-20 MED ORDER — PREDNISONE 20 MG PO TABS: 40.0000 mg | ORAL_TABLET | Freq: Every day | ORAL | 0 refills | Status: AC

## 2018-04-20 MED ORDER — FLUTICASONE PROPIONATE 50 MCG/ACT NA SUSP: 1.0000 | Freq: Every day | NASAL | 0 refills | Status: DC

## 2018-04-20 MED ORDER — ALBUTEROL SULFATE HFA 108 (90 BASE) MCG/ACT IN AERS
2.0000 | INHALATION_SPRAY | Freq: Once | RESPIRATORY_TRACT | Status: AC
  Administered 2018-04-20: 2 via RESPIRATORY_TRACT
  Filled 2018-04-20: qty 6.7

## 2018-04-20 MED ORDER — ALBUTEROL SULFATE (2.5 MG/3ML) 0.083% IN NEBU
2.5000 mg | INHALATION_SOLUTION | Freq: Once | RESPIRATORY_TRACT | Status: AC
  Administered 2018-04-20: 2.5 mg via RESPIRATORY_TRACT
  Filled 2018-04-20: qty 3

## 2018-04-20 MED ORDER — ALBUTEROL SULFATE (2.5 MG/3ML) 0.083% IN NEBU
5.0000 mg | INHALATION_SOLUTION | Freq: Once | RESPIRATORY_TRACT | Status: AC
  Administered 2018-04-20: 5 mg via RESPIRATORY_TRACT
  Filled 2018-04-20: qty 6

## 2018-04-20 MED ORDER — PREDNISONE 50 MG PO TABS
60.0000 mg | ORAL_TABLET | Freq: Once | ORAL | Status: AC
  Administered 2018-04-20: 60 mg via ORAL
  Filled 2018-04-20: qty 1

## 2018-04-20 MED ORDER — PROMETHAZINE-DM 6.25-15 MG/5ML PO SYRP: 5.0000 mL | ORAL_SOLUTION | Freq: Four times a day (QID) | ORAL | 0 refills | Status: DC | PRN

## 2018-04-20 MED ORDER — IPRATROPIUM-ALBUTEROL 0.5-2.5 (3) MG/3ML IN SOLN
3.0000 mL | Freq: Four times a day (QID) | RESPIRATORY_TRACT | Status: DC
  Administered 2018-04-20: 3 mL via RESPIRATORY_TRACT
  Filled 2018-04-20: qty 3

## 2018-04-20 NOTE — Discharge Instructions (Signed)
You likely have a viral illness, causing worsening asthma symptoms.  This should be treated symptomatically. Take prednisone as prescribed.  Use Tylenol or ibuprofen as needed for fevers, headaches, or body aches. Use Flonase daily for nasal congestion and cough. Use cough syrup as needed.  Avoid things that irritate your lungs, such as smoke, cigarettes, and anything that can trigger asthma. Make sure you stay well-hydrated with water. Wash your hands frequently to prevent spread of infection. Follow-up with your primary care doctor if your symptoms are not improving. Return to the emergency room if you develop chest pain, difficulty breathing, or any new or worsening symptoms.

## 2018-04-20 NOTE — ED Provider Notes (Signed)
[facility] EMERGENCY DEPARTMENT Provider Note   CSN: [id number] Arrival date & time: 04/20/18  2200     History   Chief Complaint Chief Complaint  Patient presents with  . Cough    HPI Priscilla Barajas is a 32 y.o. female presented for evaluation of cough and chest tightness.  Patient states that the past 2 days, she has been having a persistent dry cough.  She has associated nasal congestion, sore throat, and sinus pressure.  She has chest tightness, and feels like she has been wheezing.  She has a history of asthma, but does not have her albuterol inhaler, as she lost it and she moved houses recently.  She denies fevers, chills, ear pain, eye pain, chest pain, nausea, vomiting, abdominal pain, urinary symptoms, normal bowel movements.  She reports a friend with bronchitis.  Patient states she smokes a half a pack of cigarettes a day, denies alcohol or drug use.  She denies other medical problems, takes no other medications daily.  Patient states that she frequently gets sick between summer and fall seasons.  HPI  Past Medical History:  Diagnosis Date  . Asthma     There are no active problems to display for this patient.   Past Surgical History:  Procedure Laterality Date  . ABDOMINAL HYSTERECTOMY    . BACK SURGERY    . BLADDER SURGERY       OB History   None      Home Medications    Prior to Admission medications   Medication Sig Start Date End Date Taking? Authorizing Provider  ALBUTEROL IN Inhale into the lungs.    [provider]  cephALEXin (KEFLEX) 500 MG capsule Take 1 capsule (500 mg total) by mouth 3 (three) times daily. 04/04/14   Brandt Loosen, MD  cephALEXin (KEFLEX) 500 MG capsule Take 1 capsule (500 mg total) by mouth 3 (three) times daily. 04/04/14   Brandt Loosen, MD  fluticasone (FLONASE) 50 MCG/ACT nasal spray Place 1 spray into both nostrils daily. 04/20/18   Niyah Mamaril, PA-C  omeprazole (PRILOSEC) 20 MG capsule Take 1  capsule (20 mg total) by mouth daily. 11/06/15   Palumbo, April, MD  predniSONE (DELTASONE) 20 MG tablet Take 2 tablets (40 mg total) by mouth daily for 4 days. 04/20/18 04/24/18  Dimond Crotty, PA-C  promethazine-dextromethorphan (PROMETHAZINE-DM) 6.25-15 MG/5ML syrup Take 5 mLs by mouth 4 (four) times daily as needed for cough. 04/20/18   Carey Johndrow, PA-C    Family History History reviewed. No pertinent family history.  Social History Social History   Tobacco Use  . Smoking status: Current Every Day Smoker    Packs/day: 0.50  . Smokeless tobacco: Never Used  Substance Use Topics  . Alcohol use: No  . Drug use: No     Allergies   Penicillins   Review of Systems Review of Systems  Constitutional: Negative for fever.  HENT: Positive for congestion, sinus pressure, sinus pain and sore throat. Negative for ear pain.   Eyes: Negative for pain.  Respiratory: Positive for cough, chest tightness and shortness of breath.   Cardiovascular: Negative for chest pain.  Gastrointestinal: Negative for abdominal pain, nausea and vomiting.     Physical Exam Updated Vital Signs BP (!) 134/97   Pulse 87   Temp 98.3 F (36.8 C) (Oral)   Resp 18   Ht 5\' 2"  (1.575 m)   Wt 63.5 kg   LMP 02/01/2014   SpO2 97%   BMI  25.61 kg/m   Physical Exam  Constitutional: She is oriented to person, place, and time. She appears well-developed and well-nourished. No distress.  Appears nontoxic  HENT:  Head: Normocephalic and atraumatic.  Right Ear: Tympanic membrane, external ear and ear canal normal.  Left Ear: Tympanic membrane, external ear and ear canal normal.  Nose: Mucosal edema present. Right sinus exhibits frontal sinus tenderness. Right sinus exhibits no maxillary sinus tenderness. Left sinus exhibits frontal sinus tenderness. Left sinus exhibits no maxillary sinus tenderness.  Mouth/Throat: Uvula is midline, oropharynx is clear and moist and mucous membranes are normal. No tonsillar  exudate.  OP clear without tonsillar swelling or exudate.  Uvula midline with equal palate rise.  TMs nonerythematous and nonbulging bilaterally.  Nasal mucosal edema.  Tenderness palpation of frontal sinuses.  Eyes: Pupils are equal, round, and reactive to light. Conjunctivae and EOM are normal.  Neck: Normal range of motion.  Cardiovascular: Normal rate, regular rhythm and intact distal pulses.  Pulmonary/Chest: Effort normal. She has no decreased breath sounds. She has wheezes. She has no rhonchi. She has no rales.  Pt speaking in full sentences.  Wheezes heard in all fields.  No rales or rhonchi.  Abdominal: Soft. She exhibits no distension. There is no tenderness.  Musculoskeletal: Normal range of motion.  Lymphadenopathy:    She has no cervical adenopathy.  Neurological: She is alert and oriented to person, place, and time.  Skin: Skin is warm. Capillary refill takes less than 2 seconds.  Psychiatric: She has a normal mood and affect.  Nursing note and vitals reviewed.    ED Treatments / Results  Labs (all labs ordered are listed, but only abnormal results are displayed) Labs Reviewed - No data to display  EKG None  Radiology No results found.  Procedures Procedures (including critical care time)  Medications Ordered in ED Medications  ipratropium-albuterol (DUONEB) 0.5-2.5 (3) MG/3ML nebulizer solution 3 mL (3 mLs Nebulization Given 04/20/18 2211)  albuterol (PROVENTIL) (2.5 MG/3ML) 0.083% nebulizer solution 2.5 mg (2.5 mg Nebulization Given 04/20/18 2211)  albuterol (PROVENTIL HFA;VENTOLIN HFA) 108 (90 Base) MCG/ACT inhaler 2 puff (2 puffs Inhalation Given 04/20/18 2220)  predniSONE (DELTASONE) tablet 60 mg (60 mg Oral Given 04/20/18 2255)  albuterol (PROVENTIL) (2.5 MG/3ML) 0.083% nebulizer solution 5 mg (5 mg Nebulization Given 04/20/18 2258)     Initial Impression / Assessment and Plan / ED Course  I have reviewed the triage vital signs and the nursing notes.  Pertinent  labs & imaging results that were available during my care of the patient were reviewed by me and considered in my medical decision making (see chart for details).     Patient presenting for evaluation of cough, wheezing, chest tightness.  Exam reassuring, appears nontoxic.  She is afebrile and not tachycardic.  However, on exam, patient with significant wheezing after DuoNeb given in triage.  Will give another albuterol dose, prednisone, and reassess.  On reassessment, patient reports her breathing is better.  On exam, wheezing has improved, although not completely resolved.  Mild intermittent end expiratory wheezing persists.  Patient states she feels much better, and feels like she is ready to go home.  Discussed with patient that her symptoms are likely due to a viral illness, causing an asthma exacerbation.  Discussed treatment with prednisone, albuterol, antitussives, and Flonase.  Discussed importance of close monitoring of symptoms, follow-up with PCP if symptoms are not improving.  Pt to return to the emergency room immediately if she develops worsening asthma exacerbation.  At this time, doubt pneumonia, other bacterial infection, or need for admission.  Patient states she understands and agrees plan.  Final Clinical Impressions(s) / ED Diagnoses   Final diagnoses:  Upper respiratory tract infection, unspecified type  Mild asthma with exacerbation, unspecified whether persistent    ED Discharge Orders         Ordered    predniSONE (DELTASONE) 20 MG tablet  Daily     04/20/18 2319    promethazine-dextromethorphan (PROMETHAZINE-DM) 6.25-15 MG/5ML syrup  4 times daily PRN     04/20/18 2319    fluticasone (FLONASE) 50 MCG/ACT nasal spray  Daily     04/20/18 2319           Alveria Apley, PA-C 04/20/18 2342    Gwyneth Sprout, MD 04/21/18 1510

## 2018-04-20 NOTE — ED Triage Notes (Signed)
Pt c/o dry cough and SOB x 2 days HX asthma

## 2021-06-29 ENCOUNTER — Other Ambulatory Visit: Payer: Self-pay

## 2021-06-29 ENCOUNTER — Encounter (HOSPITAL_BASED_OUTPATIENT_CLINIC_OR_DEPARTMENT_OTHER): Payer: Self-pay | Admitting: Urology

## 2021-06-29 ENCOUNTER — Emergency Department (HOSPITAL_BASED_OUTPATIENT_CLINIC_OR_DEPARTMENT_OTHER)
Admission: EM | Admit: 2021-06-29 | Discharge: 2021-06-29 | Disposition: A | Discharge status: Home / Self Care | Payer: Medicaid Other | Attending: Emergency Medicine | Admitting: Emergency Medicine

## 2021-06-29 DIAGNOSIS — L02415 Cutaneous abscess of right lower limb: Secondary | ICD-10-CM | POA: Diagnosis not present

## 2021-06-29 DIAGNOSIS — J45909 Unspecified asthma, uncomplicated: Secondary | ICD-10-CM | POA: Diagnosis not present

## 2021-06-29 DIAGNOSIS — Z7952 Long term (current) use of systemic steroids: Secondary | ICD-10-CM | POA: Insufficient documentation

## 2021-06-29 DIAGNOSIS — F17210 Nicotine dependence, cigarettes, uncomplicated: Secondary | ICD-10-CM | POA: Insufficient documentation

## 2021-06-29 DIAGNOSIS — L0291 Cutaneous abscess, unspecified: Secondary | ICD-10-CM

## 2021-06-29 MED ORDER — LIDOCAINE-EPINEPHRINE (PF) 2 %-1:200000 IJ SOLN
10.0000 mL | Freq: Once | INTRAMUSCULAR | Status: AC
  Administered 2021-06-29: 10 mL via INTRADERMAL
  Filled 2021-06-29: qty 20

## 2021-06-29 NOTE — ED Provider Notes (Signed)
[facility]PARTMENT Provider Note   CSN: [id number] Arrival date & time: 06/29/21  1758     History Chief Complaint  Patient presents with   Abscess    Priscilla Barajas is a 35 y.o. female.  35 yo F with with a chief complaints of an abscess to the back of her right leg.  This been going on for couple days.  No drainage no fevers or chills.  Complains that it is significantly painful.  The history is provided by the patient.  Abscess Associated symptoms: no fever, no headaches, no nausea and no vomiting   Illness Severity:  Moderate Onset quality:  Gradual Duration:  2 days Timing:  Constant Progression:  Worsening Chronicity:  New Associated symptoms: no chest pain, no congestion, no fever, no headaches, no myalgias, no nausea, no rhinorrhea, no shortness of breath, no vomiting and no wheezing       Past Medical History:  Diagnosis Date   Asthma     There are no problems to display for this patient.   Past Surgical History:  Procedure Laterality Date   ABDOMINAL HYSTERECTOMY     BACK SURGERY     BLADDER SURGERY       OB History   No obstetric history on file.     History reviewed. No pertinent family history.  Social History   Tobacco Use   Smoking status: Every Day    Packs/day: 0.50    Types: Cigarettes   Smokeless tobacco: Never  Substance Use Topics   Alcohol use: No   Drug use: No    Home Medications Prior to Admission medications   Medication Sig Start Date End Date Taking? Authorizing Provider  ALBUTEROL IN Inhale into the lungs.    [provider]  cephALEXin (KEFLEX) 500 MG capsule Take 1 capsule (500 mg total) by mouth 3 (three) times daily. 04/04/14   Elyn Peers, MD  cephALEXin (KEFLEX) 500 MG capsule Take 1 capsule (500 mg total) by mouth 3 (three) times daily. 04/04/14   Elyn Peers, MD  fluticasone (FLONASE) 50 MCG/ACT nasal spray Place 1 spray into both nostrils daily. 04/20/18   Caccavale, Sophia,  PA-C  omeprazole (PRILOSEC) 20 MG capsule Take 1 capsule (20 mg total) by mouth daily. 11/06/15   Palumbo, April, MD  promethazine-dextromethorphan (PROMETHAZINE-DM) 6.25-15 MG/5ML syrup Take 5 mLs by mouth 4 (four) times daily as needed for cough. 04/20/18   Caccavale, Sophia, PA-C    Allergies    Clindamycin/lincomycin and Penicillins  Review of Systems   Review of Systems  Constitutional:  Negative for chills and fever.  HENT:  Negative for congestion and rhinorrhea.   Eyes:  Negative for redness and visual disturbance.  Respiratory:  Negative for shortness of breath and wheezing.   Cardiovascular:  Negative for chest pain and palpitations.  Gastrointestinal:  Negative for nausea and vomiting.  Genitourinary:  Negative for dysuria and urgency.  Musculoskeletal:  Negative for arthralgias and myalgias.  Skin:  Positive for color change and wound. Negative for pallor.  Neurological:  Negative for dizziness and headaches.   Physical Exam Updated Vital Signs BP 126/85 (BP Location: Right Arm)   Pulse 69   Temp 98.2 F (36.8 C) (Oral)   Resp 17   Ht 5\' 2"  (1.575 m)   Wt 72.6 kg   LMP 02/01/2014   SpO2 100%   BMI 29.26 kg/m   Physical Exam Vitals and nursing note reviewed.  Constitutional:  General: She is not in acute distress.    Appearance: She is well-developed. She is not diaphoretic.  HENT:     Head: Normocephalic and atraumatic.  Eyes:     Pupils: Pupils are equal, round, and reactive to light.  Cardiovascular:     Rate and Rhythm: Normal rate and regular rhythm.     Heart sounds: No murmur heard.   No friction rub. No gallop.  Pulmonary:     Effort: Pulmonary effort is normal.     Breath sounds: No wheezing or rales.  Abdominal:     General: There is no distension.     Palpations: Abdomen is soft.     Tenderness: There is no abdominal tenderness.  Musculoskeletal:        General: No tenderness.     Cervical back: Normal range of motion and neck supple.   Skin:    General: Skin is warm and dry.     Findings: Rash present.     Comments: Area of fluctuance to the right posterior thigh.  Neurological:     Mental Status: She is alert and oriented to person, place, and time.  Psychiatric:        Behavior: Behavior normal.    ED Results / Procedures / Treatments   Labs (all labs ordered are listed, but only abnormal results are displayed) Labs Reviewed - No data to display  EKG None  Radiology No results found.  Procedures .Marland KitchenIncision and Drainage  Date/Time: 06/29/2021 7:19 PM Performed by: Melene Plan, DO Authorized by: Melene Plan, DO   Consent:    Consent obtained:  Verbal   Consent given by:  Patient   Risks, benefits, and alternatives were discussed: yes     Risks discussed:  Bleeding, incomplete drainage and infection   Alternatives discussed:  No treatment, delayed treatment and alternative treatment Universal protocol:    Patient identity confirmed:  Verbally with patient Location:    Type:  Abscess   Location:  Lower extremity   Lower extremity location:  Leg   Leg location:  R upper leg Pre-procedure details:    Skin preparation:  Chlorhexidine Sedation:    Sedation type:  None Anesthesia:    Anesthesia method:  Local infiltration   Local anesthetic:  Lidocaine 2% WITH epi Procedure type:    Complexity:  Complex Procedure details:    Ultrasound guidance: no     Needle aspiration: no     Incision types:  Single straight   Incision depth:  Subcutaneous   Wound management:  Probed and deloculated   Drainage:  Bloody and purulent   Drainage amount:  Moderate   Wound treatment:  Wound left open   Packing materials:  None Post-procedure details:    Procedure completion:  Tolerated well, no immediate complications   Medications Ordered in ED Medications  lidocaine-EPINEPHrine (XYLOCAINE W/EPI) 2 %-1:200000 (PF) injection 10 mL (10 mLs Intradermal Given by Other 06/29/21 1835)    ED Course  I have  reviewed the triage vital signs and the nursing notes.  Pertinent labs & imaging results that were available during my care of the patient were reviewed by me and considered in my medical decision making (see chart for details).    MDM Rules/Calculators/A&P                           35 yo F with a chief complaints of an abscess.  Will perform I&D at bedside.  7:19 PM:  I have discussed the diagnosis/risks/treatment options with the patient and believe the pt to be eligible for discharge home to follow-up with PCP. We also discussed returning to the ED immediately if new or worsening sx occur. We discussed the sx which are most concerning (e.g., sudden worsening pain, fever, inability to tolerate by mouth) that necessitate immediate return. Medications administered to the patient during their visit and any new prescriptions provided to the patient are listed below.  Medications given during this visit Medications  lidocaine-EPINEPHrine (XYLOCAINE W/EPI) 2 %-1:200000 (PF) injection 10 mL (10 mLs Intradermal Given by Other 06/29/21 1835)     The patient appears reasonably screen and/or stabilized for discharge and I doubt any other medical condition or other [facility] requiring further screening, evaluation, or treatment in the ED at this time prior to discharge.   Final Clinical Impression(s) / ED Diagnoses Final diagnoses:  Abscess    Rx / DC Orders ED Discharge Orders     None        Deno Etienne, DO 06/29/21 1920

## 2021-06-29 NOTE — ED Triage Notes (Signed)
Abscess to back of right thigh, painful to touch, heat noted. Denies any drainage.

## 2021-06-29 NOTE — Discharge Instructions (Signed)
Warm compresses at least 4 times a day to keep this open To keep draining.  Please return for rapid spreading redness or if you develop a fever.  Follow-up with your family doctor in the office.

## 2021-10-10 ENCOUNTER — Encounter (HOSPITAL_BASED_OUTPATIENT_CLINIC_OR_DEPARTMENT_OTHER): Payer: Self-pay

## 2021-10-10 ENCOUNTER — Other Ambulatory Visit: Payer: Self-pay

## 2021-10-10 ENCOUNTER — Emergency Department (HOSPITAL_BASED_OUTPATIENT_CLINIC_OR_DEPARTMENT_OTHER)
Admission: EM | Admit: 2021-10-10 | Discharge: 2021-10-10 | Disposition: A | Discharge status: Home / Self Care | Payer: Medicaid Other | Attending: Emergency Medicine | Admitting: Emergency Medicine

## 2021-10-10 DIAGNOSIS — G8918 Other acute postprocedural pain: Secondary | ICD-10-CM | POA: Insufficient documentation

## 2021-10-10 MED ORDER — DOXYCYCLINE HYCLATE 100 MG PO CAPS: 100.0000 mg | ORAL_CAPSULE | Freq: Two times a day (BID) | ORAL | 0 refills | Status: AC

## 2021-10-10 NOTE — ED Triage Notes (Signed)
Pt states she had stimulator placed 1/26-removed 2/17 due to infection-states she is taking abx-states today area is leaking blood and fluid-NAD-steady gait-[facility] ED PTA

## 2021-10-10 NOTE — Discharge Instructions (Addendum)
Please follow-up with your urology office. Take the antibiotics. If you develop worsening drainage, pus, redness, fever, swelling or other new concerning symptom, go back to ER for reevaluation.

## 2021-10-10 NOTE — ED Notes (Signed)
Pt verbalized understanding to pick up prescriptions at pharmacy listed on d/c instructions.  °

## 2021-10-10 NOTE — ED Notes (Signed)
ED Provider at bedside. 

## 2021-10-10 NOTE — ED Provider Notes (Signed)
[place] EMERGENCY DEPARTMENT Provider Note   CSN: [id number] Arrival date & time: 10/10/21  1602     History  Chief Complaint  Patient presents with   Post-op Problem    Priscilla Barajas is a 36 y.o. female.  Presents to ER with concern for postop pain. Patient underwent InterStim placement for urinary incontinence on 09/14/2021 with Dr. Amalia Hailey with [facility] urology.  She was diagnosed with postoperative infection and had surgery to remove the implantation on 2/17.  Patient reports that earlier today she noted drainage from the site and also has had some pain around her incision site.  The drainage was mostly clear and had some blood in it.  Not like the thicker pus drainage that she had with the postop infection.  No fevers or chills.  HPI     Home Medications Prior to Admission medications   Medication Sig Start Date End Date Taking? Authorizing Provider  doxycycline (VIBRAMYCIN) 100 MG capsule Take 1 capsule (100 mg total) by mouth 2 (two) times daily for 7 days. 10/10/21 10/17/21 Yes Lucrezia Starch, MD  ALBUTEROL IN Inhale into the lungs.    [provider]  cephALEXin (KEFLEX) 500 MG capsule Take 1 capsule (500 mg total) by mouth 3 (three) times daily. 04/04/14   Elyn Peers, MD  cephALEXin (KEFLEX) 500 MG capsule Take 1 capsule (500 mg total) by mouth 3 (three) times daily. 04/04/14   Elyn Peers, MD  fluticasone (FLONASE) 50 MCG/ACT nasal spray Place 1 spray into both nostrils daily. 04/20/18   Caccavale, Sophia, PA-C  omeprazole (PRILOSEC) 20 MG capsule Take 1 capsule (20 mg total) by mouth daily. 11/06/15   Palumbo, April, MD  promethazine-dextromethorphan (PROMETHAZINE-DM) 6.25-15 MG/5ML syrup Take 5 mLs by mouth 4 (four) times daily as needed for cough. 04/20/18   Caccavale, Sophia, PA-C      Allergies    Clindamycin/lincomycin and Penicillins    Review of Systems   Review of Systems  Constitutional:  Negative for chills and fever.  HENT:  Negative  for ear pain and sore throat.   Eyes:  Negative for pain and visual disturbance.  Respiratory:  Negative for cough and shortness of breath.   Cardiovascular:  Negative for chest pain and palpitations.  Gastrointestinal:  Negative for abdominal pain and vomiting.  Genitourinary:  Negative for dysuria and hematuria.  Musculoskeletal:  Positive for back pain. Negative for arthralgias.  Skin:  Positive for wound. Negative for color change and rash.  Neurological:  Negative for seizures and syncope.  All other systems reviewed and are negative.  Physical Exam Updated Vital Signs BP (!) 143/100 (BP Location: Left Arm)    Pulse 64    Temp 98.3 F (36.8 C) (Oral)    Resp 16    Ht 5\' 2"  (1.575 m)    Wt 73.5 kg    LMP 02/01/2014    SpO2 100%    BMI 29.63 kg/m  Physical Exam Vitals and nursing note reviewed.  Constitutional:      General: She is not in acute distress.    Appearance: She is well-developed.  HENT:     Head: Normocephalic and atraumatic.  Eyes:     Conjunctiva/sclera: Conjunctivae normal.  Cardiovascular:     Rate and Rhythm: Normal rate.     Pulses: Normal pulses.  Pulmonary:     Effort: Pulmonary effort is normal. No respiratory distress.  Abdominal:     Palpations: Abdomen is soft.  Tenderness: There is no abdominal tenderness.  Musculoskeletal:        General: No swelling.     Cervical back: Neck supple.  Skin:    General: Skin is warm and dry.     Capillary Refill: Capillary refill takes less than 2 seconds.     Comments: In the left lower lumbar region there is a 4 to 5 cm long postoperative incision that appears intact.  There is no surrounding erythema, there is no active drainage, there is no fluctuance or induration around the incision.  No wound dehiscence.  Neurological:     Mental Status: She is alert.  Psychiatric:        Mood and Affect: Mood normal.    ED Results / Procedures / Treatments   Labs (all labs ordered are listed, but only abnormal  results are displayed) Labs Reviewed - No data to display  EKG None  Radiology No results found.  Procedures Procedures    Medications Ordered in ED Medications - No data to display  ED Course/ Medical Decision Making/ A&P                           Medical Decision Making Risk Prescription drug management.   36 year old lady presents to ER with concern for drainage from incision site.  She recently had a urologic electronic stimulator device removed due to postoperative infection.  On exam today, her incision is intact and there is no evidence for active infection at present on exam.  No fever, no redness, swelling.  Do not appreciate any ongoing drainage at present.  I discussed the case with Dr. Thomasene Mohair on-call for her primary urologist, Dr. Amalia Hailey.  He states that he is well acquainted with patient and the recent events.  He states that he would recommend placing patient on antibiotic's, specifically request doxycycline as precaution in case of possible early post op infection.  If patient having ongoing drainage that he requests culture to be sent.  Reassessed patient states and there is still no active drainage, applied very gentle pressure near the incision and still no drainage was appreciated.  Patient reports she is already on doxycycline but is almost done with the prescription.  We will give her additional weeklong Rx for now.  She already has appointment with her urologist on Friday.  Based on her current physical exam, discussion with urology, feel she can be discharged and managed in the out patient setting right now. Discussed return precautions.    After the discussed management above, the patient was determined to be safe for discharge.  The patient was in agreement with this plan and all questions regarding their care were answered.  ED return precautions were discussed and the patient will return to the ED with any significant worsening of  condition.         Final Clinical Impression(s) / ED Diagnoses Final diagnoses:  Post-operative pain    Rx / DC Orders ED Discharge Orders          Ordered    doxycycline (VIBRAMYCIN) 100 MG capsule  2 times daily        10/10/21 1711              Lucrezia Starch, MD 10/10/21 1724

## 2023-11-29 ENCOUNTER — Emergency Department (HOSPITAL_BASED_OUTPATIENT_CLINIC_OR_DEPARTMENT_OTHER)
Admission: EM | Admit: 2023-11-29 | Discharge: 2023-11-30 | Disposition: A | Discharge status: Home / Self Care | Attending: Emergency Medicine | Admitting: Emergency Medicine

## 2023-11-29 ENCOUNTER — Encounter (HOSPITAL_BASED_OUTPATIENT_CLINIC_OR_DEPARTMENT_OTHER): Payer: Self-pay | Admitting: Emergency Medicine

## 2023-11-29 ENCOUNTER — Emergency Department (HOSPITAL_BASED_OUTPATIENT_CLINIC_OR_DEPARTMENT_OTHER)

## 2023-11-29 ENCOUNTER — Other Ambulatory Visit: Payer: Self-pay

## 2023-11-29 DIAGNOSIS — M545 Low back pain, unspecified: Secondary | ICD-10-CM | POA: Diagnosis present

## 2023-11-29 DIAGNOSIS — J45909 Unspecified asthma, uncomplicated: Secondary | ICD-10-CM | POA: Diagnosis not present

## 2023-11-29 DIAGNOSIS — N12 Tubulo-interstitial nephritis, not specified as acute or chronic: Secondary | ICD-10-CM | POA: Insufficient documentation

## 2023-11-29 DIAGNOSIS — Z7951 Long term (current) use of inhaled steroids: Secondary | ICD-10-CM | POA: Diagnosis not present

## 2023-11-29 LAB — URINALYSIS, ROUTINE W REFLEX MICROSCOPIC
Bilirubin Urine: NEGATIVE
Glucose, UA: NEGATIVE mg/dL
Ketones, ur: NEGATIVE mg/dL
Nitrite: POSITIVE — AB
Protein, ur: NEGATIVE mg/dL
Specific Gravity, Urine: 1.025 (ref 1.005–1.030)
pH: 6 (ref 5.0–8.0)

## 2023-11-29 LAB — CBC
HCT: 39.4 % (ref 36.0–46.0)
Hemoglobin: 13.1 g/dL (ref 12.0–15.0)
MCH: 28.7 pg (ref 26.0–34.0)
MCHC: 33.2 g/dL (ref 30.0–36.0)
MCV: 86.2 fL (ref 80.0–100.0)
Platelets: 284 10*3/uL (ref 150–400)
RBC: 4.57 MIL/uL (ref 3.87–5.11)
RDW: 13.4 % (ref 11.5–15.5)
WBC: 13.7 10*3/uL — ABNORMAL HIGH (ref 4.0–10.5)
nRBC: 0 % (ref 0.0–0.2)

## 2023-11-29 LAB — LIPASE, BLOOD: Lipase: 26 U/L (ref 11–51)

## 2023-11-29 LAB — COMPREHENSIVE METABOLIC PANEL WITH GFR
ALT: 27 U/L (ref 0–44)
AST: 23 U/L (ref 15–41)
Albumin: 4 g/dL (ref 3.5–5.0)
Alkaline Phosphatase: 84 U/L (ref 38–126)
Anion gap: 8 (ref 5–15)
BUN: 19 mg/dL (ref 6–20)
CO2: 25 mmol/L (ref 22–32)
Calcium: 9 mg/dL (ref 8.9–10.3)
Chloride: 104 mmol/L (ref 98–111)
Creatinine, Ser: 0.68 mg/dL (ref 0.44–1.00)
GFR, Estimated: >60 mL/min (ref >60)
Glucose, Bld: 112 mg/dL — ABNORMAL HIGH (ref 70–99)
Potassium: 4.1 mmol/L (ref 3.5–5.1)
Sodium: 137 mmol/L (ref 135–145)
Total Bilirubin: 0.4 mg/dL (ref 0.0–1.2)
Total Protein: 7.3 g/dL (ref 6.5–8.1)

## 2023-11-29 LAB — URINALYSIS, MICROSCOPIC (REFLEX): WBC, UA: >50 WBC/hpf (ref 0–5)

## 2023-11-29 LAB — PREGNANCY, URINE: Preg Test, Ur: NEGATIVE

## 2023-11-29 MED ORDER — ONDANSETRON HCL 4 MG/2ML IJ SOLN
4.0000 mg | Freq: Once | INTRAMUSCULAR | Status: AC
  Administered 2023-11-29: 4 mg via INTRAVENOUS
  Filled 2023-11-29: qty 2

## 2023-11-29 MED ORDER — IOHEXOL 300 MG/ML  SOLN
100.0000 mL | Freq: Once | INTRAMUSCULAR | Status: AC | PRN
  Administered 2023-11-29: 100 mL via INTRAVENOUS

## 2023-11-29 MED ORDER — SODIUM CHLORIDE 0.9 % IV SOLN
2.0000 g | Freq: Once | INTRAVENOUS | Status: AC
  Administered 2023-11-29: 2 g via INTRAVENOUS
  Filled 2023-11-29: qty 20

## 2023-11-29 MED ORDER — MORPHINE SULFATE (PF) 4 MG/ML IV SOLN
4.0000 mg | Freq: Once | INTRAVENOUS | Status: AC
  Administered 2023-11-29: 4 mg via INTRAVENOUS
  Filled 2023-11-29: qty 1

## 2023-11-29 MED ORDER — LACTATED RINGERS IV BOLUS
1000.0000 mL | Freq: Once | INTRAVENOUS | Status: AC
  Administered 2023-11-29: 1000 mL via INTRAVENOUS

## 2023-11-29 NOTE — ED Provider Notes (Signed)
 [place] [facility] Provider Note   CSN: [id number] Arrival date & time: 11/29/23  2120     History {Add pertinent medical, surgical, social history, OB history to HPI:1} Chief Complaint  Patient presents with   Abdominal Pain   Back Pain    Priscilla Barajas is a 38 y.o. female.  HPI     Home Medications Prior to Admission medications   Medication Sig Start Date End Date Taking? Authorizing Provider  ALBUTEROL IN Inhale into the lungs.    [provider]  cephALEXin (KEFLEX) 500 MG capsule Take 1 capsule (500 mg total) by mouth 3 (three) times daily. 04/04/14   Bethel Brooms, MD  cephALEXin (KEFLEX) 500 MG capsule Take 1 capsule (500 mg total) by mouth 3 (three) times daily. 04/04/14   Manly, Julie, MD  fluticasone (FLONASE) 50 MCG/ACT nasal spray Place 1 spray into both nostrils daily. 04/20/18   Caccavale, Sophia, PA-C  omeprazole (PRILOSEC) 20 MG capsule Take 1 capsule (20 mg total) by mouth daily. 11/06/15   Palumbo, April, MD  promethazine-dextromethorphan (PROMETHAZINE-DM) 6.25-15 MG/5ML syrup Take 5 mLs by mouth 4 (four) times daily as needed for cough. 04/20/18   Caccavale, Sophia, PA-C      Allergies    Clindamycin/lincomycin and Penicillins    Review of Systems   Review of Systems  Physical Exam Updated Vital Signs BP 126/86 (BP Location: Right Arm)   Pulse 70   Temp 97.9 F (36.6 C)   Resp 18   Ht 5\' 2"  (1.575 m)   Wt 68 kg   LMP 02/01/2014   SpO2 99%   BMI 27.44 kg/m  Physical Exam  ED Results / Procedures / Treatments   Labs (all labs ordered are listed, but only abnormal results are displayed) Labs Reviewed  COMPREHENSIVE METABOLIC PANEL WITH GFR - Abnormal; Notable for the following components:      Result Value   Glucose, Bld 112 (*)    All other components within normal limits  CBC - Abnormal; Notable for the following components:   WBC 13.7 (*)    All other components within normal limits   URINALYSIS, ROUTINE W REFLEX MICROSCOPIC - Abnormal; Notable for the following components:   Hgb urine dipstick TRACE (*)    Nitrite POSITIVE (*)    Leukocytes,Ua SMALL (*)    All other components within normal limits  URINALYSIS, MICROSCOPIC (REFLEX) - Abnormal; Notable for the following components:   Bacteria, UA MANY (*)    All other components within normal limits  LIPASE, BLOOD  PREGNANCY, URINE    EKG None  Radiology No results found.  Procedures Procedures  {Document cardiac monitor, telemetry assessment procedure when appropriate:1}  Medications Ordered in ED Medications - No data to display  ED Course/ Medical Decision Making/ A&P   {   Click here for ABCD2, HEART and other calculatorsREFRESH Note before signing :1}                              Medical Decision Making Amount and/or Complexity of Data Reviewed Labs: ordered.   ***  {Document critical care time when appropriate:1} {Document review of labs and clinical decision tools ie heart score, Chads2Vasc2 etc:1}  {Document your independent review of radiology images, and any outside records:1} {Document your discussion with family members, caretakers, and with consultants:1} {Document social determinants of health affecting pt's care:1} {Document your decision making why or  why not admission, treatments were needed:1} Final Clinical Impression(s) / ED Diagnoses Final diagnoses:  None    Rx / DC Orders ED Discharge Orders     None

## 2023-11-29 NOTE — ED Triage Notes (Signed)
 Pt reports RLQ pain x 2d and general lower back pain x about a week; nausea today

## 2023-11-30 MED ORDER — ACETAMINOPHEN 325 MG PO TABS: 650.0000 mg | ORAL_TABLET | Freq: Four times a day (QID) | ORAL | 0 refills | Status: AC | PRN

## 2023-11-30 MED ORDER — ONDANSETRON HCL 4 MG PO TABS: 4.0000 mg | ORAL_TABLET | ORAL | 0 refills | Status: DC | PRN

## 2023-11-30 MED ORDER — CEFADROXIL 500 MG PO CAPS: 500.0000 mg | ORAL_CAPSULE | Freq: Two times a day (BID) | ORAL | 0 refills | Status: AC

## 2023-11-30 NOTE — ED Provider Notes (Signed)
  Provider Note MRN:  [id number]  Arrival date & time: 11/30/23    ED Course and Medical Decision Making  Assumed care from Dr Danna Duster at shift change.  See note from prior team for complete details, in brief:  Clinical Course as of 11/30/23 0013  Sat Nov 29, 2023  2325 Handoff ES 38 yo female with rlq abd pain/back pain/nausea UTI+, given rocephin Pending CTAP [SG]  Sun Nov 30, 2023  0000 CTAP unremarkable, will treat pt for presumed pyelo, she is feeling better, not septic, no renal failure [SG]  0012 Tolerating PO, feeling better, stable for dc [SG]    Clinical Course User Index [SG] Russella Courts A, DO    The patient improved significantly and was discharged in stable condition. Detailed discussions were had with the patient/guardian regarding current findings, and need for close f/u with PCP or on call doctor. The patient/guardian has been instructed to return immediately if the symptoms worsen in any way for re-evaluation. Patient/guardian verbalized understanding and is in agreement with current care plan. All questions answered prior to discharge.    Procedures  Final Clinical Impressions(s) / ED Diagnoses     ICD-10-CM   1. Pyelonephritis  N12       ED Discharge Orders     None       Discharge Instructions   None        Teddi Favors, DO 11/30/23 0013

## 2023-11-30 NOTE — Discharge Instructions (Addendum)
 It was a pleasure caring for you today in the emergency department.  Drink plenty of fluids  Follow up with your pcp  Please return to the emergency department for any worsening or worrisome symptoms including but not limited to: ongoing vomiting, high fever, worsening pain, etc

## 2023-12-02 LAB — URINE CULTURE: Culture: >=100000 — AB

## 2023-12-03 ENCOUNTER — Telehealth (HOSPITAL_BASED_OUTPATIENT_CLINIC_OR_DEPARTMENT_OTHER): Payer: Self-pay | Admitting: Emergency Medicine

## 2023-12-03 NOTE — Progress Notes (Signed)
 ED Antimicrobial Stewardship Positive Culture Follow Up   Priscilla Barajas is an 38 y.o. female who presented to [facility] on 11/29/2023 with a chief complaint of  Chief Complaint  Patient presents with   Abdominal Pain   Back Pain    Recent Results (from the past 720 hours)  Urine Culture     Status: Abnormal   Collection Time: 11/29/23  9:58 PM   Specimen: Urine, Clean Catch  Result Value Ref Range Status   Specimen Description   Final    URINE, CLEAN CATCH Performed at [facility], [address] [place], [place] [address]    Special Requests   Final    NONE Performed at [facility], [address] [place], [place] [address]    Culture (A)  Final    >=100,000 COLONIES/mL ESCHERICHIA COLI Confirmed Extended Spectrum Beta-Lactamase Producer (ESBL).  In bloodstream infections from ESBL organisms, carbapenems are preferred over piperacillin/tazobactam. They are shown to have a lower risk of mortality.    Report Status 12/02/2023 FINAL  Final   Organism ID, Bacteria ESCHERICHIA COLI (A)  Final      Susceptibility   Escherichia coli - MIC*    AMPICILLIN >=32 RESISTANT Resistant     CEFAZOLIN >=64 RESISTANT Resistant     CEFEPIME 16 RESISTANT Resistant     CEFTRIAXONE >=64 RESISTANT Resistant     CIPROFLOXACIN >=4 RESISTANT Resistant     GENTAMICIN >=16 RESISTANT Resistant     IMIPENEM 1 SENSITIVE Sensitive     NITROFURANTOIN <=16 SENSITIVE Sensitive     TRIMETH/SULFA <=20 SENSITIVE Sensitive     AMPICILLIN/SULBACTAM >=32 RESISTANT Resistant     PIP/TAZO <=4 SENSITIVE Sensitive ug/mL    * >=100,000 COLONIES/mL ESCHERICHIA COLI    [x]  Treated with cefadroxil 500 mg BID, organism resistant to prescribed antimicrobial  New antibiotic prescription: Bactrim DS (800 mg/160 mg) - take 1 tablet twice daily for 7 days  38 yo F presented w/ abdominal and R flank pain. No dysuria but reported increased urinary frequency and pressure. PMH of kidney stones,  CTAP was wnl. UA was positive for nitrites, small leuks, WBC >50, squamous 0-5. Discharged on cefadroxil, urine cx has now resulted ESBL E. Coli as resistant to this antibiotic.  Call patient to STOP cefadroxil and start Bactrim DS 1 tablet twice daily for 7 days.  ED Provider: Inocente Manifold, DO   Galen Russman 12/03/2023, 8:54 AM Clinical Pharmacist Monday - Friday phone -  [phone] Saturday - Sunday phone - [phone]

## 2023-12-03 NOTE — Telephone Encounter (Signed)
 Post ED Visit - Positive Culture Follow-up: Unsuccessful Patient Follow-up  Culture assessed and recommendations reviewed by:  []  [place], Pharm.D. []  Skeet Duke, Pharm.D., BCPS AQ-ID []  Leslee Rase, Pharm.D., BCPS []  Garland Junk, [address].D., BCPS []  [place], [address].D., BCPS, AAHIVP []  Alcide Aly, Pharm.D., BCPS, AAHIVP [x]  Argentina Bees, PharmD []  Thomasine Flick, PharmD, BCPS  Positive urine culture  []  Patient discharged without antimicrobial prescription and treatment is now indicated [x]  Organism is resistant to prescribed ED discharge antimicrobial []  Patient with positive blood cultures   Unable to contact patient by phone, letter will be sent to address on file  Plan: Stop Cefadroxil Start Bactrim DS one tab BID for seven days - Inocente Manifold, DO  Cathleen Coach C Camyla Camposano 12/03/2023, 3:00 PM

## 2023-12-11 ENCOUNTER — Telehealth (HOSPITAL_BASED_OUTPATIENT_CLINIC_OR_DEPARTMENT_OTHER): Payer: Self-pay

## 2023-12-11 NOTE — Telephone Encounter (Signed)
 Pt called after receiving letter.  Informed of need for different antibiotic, pt had stopped taking Cefadroxil . Rx for "Bactrim DS x 1 tab BID x 7 days" called in and given to Rph @ [facility] [phone].

## 2024-01-27 ENCOUNTER — Emergency Department (HOSPITAL_BASED_OUTPATIENT_CLINIC_OR_DEPARTMENT_OTHER): Payer: PRIVATE HEALTH INSURANCE

## 2024-01-27 ENCOUNTER — Encounter (HOSPITAL_BASED_OUTPATIENT_CLINIC_OR_DEPARTMENT_OTHER): Payer: Self-pay | Admitting: Emergency Medicine

## 2024-01-27 ENCOUNTER — Other Ambulatory Visit: Payer: Self-pay

## 2024-01-27 ENCOUNTER — Emergency Department (HOSPITAL_BASED_OUTPATIENT_CLINIC_OR_DEPARTMENT_OTHER)
Admission: EM | Admit: 2024-01-27 | Discharge: 2024-01-28 | Disposition: A | Discharge status: Home / Self Care | Payer: PRIVATE HEALTH INSURANCE | Attending: Emergency Medicine | Admitting: Emergency Medicine

## 2024-01-27 DIAGNOSIS — R109 Unspecified abdominal pain: Secondary | ICD-10-CM | POA: Diagnosis present

## 2024-01-27 DIAGNOSIS — N3000 Acute cystitis without hematuria: Secondary | ICD-10-CM | POA: Diagnosis not present

## 2024-01-27 DIAGNOSIS — J45909 Unspecified asthma, uncomplicated: Secondary | ICD-10-CM | POA: Diagnosis not present

## 2024-01-27 LAB — CBC WITH DIFFERENTIAL/PLATELET
Abs Immature Granulocytes: 0.07 10*3/uL (ref 0.00–0.07)
Basophils Absolute: 0.1 10*3/uL (ref 0.0–0.1)
Basophils Relative: 0 %
Eosinophils Absolute: 0.3 10*3/uL (ref 0.0–0.5)
Eosinophils Relative: 2 %
HCT: 37.4 % (ref 36.0–46.0)
Hemoglobin: 12.5 g/dL (ref 12.0–15.0)
Immature Granulocytes: 1 %
Lymphocytes Relative: 29 %
Lymphs Abs: 4.1 10*3/uL — ABNORMAL HIGH (ref 0.7–4.0)
MCH: 29.2 pg (ref 26.0–34.0)
MCHC: 33.4 g/dL (ref 30.0–36.0)
MCV: 87.4 fL (ref 80.0–100.0)
Monocytes Absolute: 1 10*3/uL (ref 0.1–1.0)
Monocytes Relative: 7 %
Neutro Abs: 8.5 10*3/uL — ABNORMAL HIGH (ref 1.7–7.7)
Neutrophils Relative %: 61 %
Platelets: 265 10*3/uL (ref 150–400)
RBC: 4.28 MIL/uL (ref 3.87–5.11)
RDW: 14 % (ref 11.5–15.5)
WBC: 14 10*3/uL — ABNORMAL HIGH (ref 4.0–10.5)
nRBC: 0 % (ref 0.0–0.2)

## 2024-01-27 LAB — COMPREHENSIVE METABOLIC PANEL WITH GFR
ALT: 35 U/L (ref 0–44)
AST: 23 U/L (ref 15–41)
Albumin: 4.5 g/dL (ref 3.5–5.0)
Alkaline Phosphatase: 98 U/L (ref 38–126)
Anion gap: 11 (ref 5–15)
BUN: 12 mg/dL (ref 6–20)
CO2: 26 mmol/L (ref 22–32)
Calcium: 9.1 mg/dL (ref 8.9–10.3)
Chloride: 103 mmol/L (ref 98–111)
Creatinine, Ser: 0.68 mg/dL (ref 0.44–1.00)
GFR, Estimated: >60 mL/min (ref >60)
Glucose, Bld: 88 mg/dL (ref 70–99)
Potassium: 3.7 mmol/L (ref 3.5–5.1)
Sodium: 139 mmol/L (ref 135–145)
Total Bilirubin: <0.2 mg/dL (ref 0.0–1.2)
Total Protein: 7.2 g/dL (ref 6.5–8.1)

## 2024-01-27 LAB — URINALYSIS, ROUTINE W REFLEX MICROSCOPIC
Bilirubin Urine: NEGATIVE
Glucose, UA: NEGATIVE mg/dL
Ketones, ur: NEGATIVE mg/dL
Nitrite: POSITIVE — AB
Protein, ur: NEGATIVE mg/dL
Specific Gravity, Urine: 1.015 (ref 1.005–1.030)
pH: 6 (ref 5.0–8.0)

## 2024-01-27 LAB — LIPASE, BLOOD: Lipase: 20 U/L (ref 11–51)

## 2024-01-27 LAB — URINALYSIS, MICROSCOPIC (REFLEX): WBC, UA: >50 WBC/hpf (ref 0–5)

## 2024-01-27 MED ORDER — OXYBUTYNIN CHLORIDE ER 10 MG PO TB24: 10.0000 mg | ORAL_TABLET | Freq: Every day | ORAL | 0 refills | Status: AC

## 2024-01-27 MED ORDER — CEFADROXIL 500 MG PO CAPS: 500.0000 mg | ORAL_CAPSULE | Freq: Two times a day (BID) | ORAL | 0 refills | Status: DC

## 2024-01-27 MED ORDER — HALOPERIDOL LACTATE 5 MG/ML IJ SOLN
2.0000 mg | Freq: Once | INTRAMUSCULAR | Status: AC
  Administered 2024-01-28: 2 mg via INTRAMUSCULAR
  Filled 2024-01-27: qty 1

## 2024-01-27 MED ORDER — CEFTRIAXONE SODIUM 1 G IJ SOLR
1.0000 g | Freq: Once | INTRAMUSCULAR | Status: AC
  Administered 2024-01-28: 1 g via INTRAMUSCULAR
  Filled 2024-01-27: qty 10

## 2024-01-27 MED ORDER — OXYCODONE HCL 5 MG PO TABS
5.0000 mg | ORAL_TABLET | ORAL | Status: AC
  Administered 2024-01-28: 5 mg via ORAL
  Filled 2024-01-27: qty 1

## 2024-01-27 MED ORDER — OXYBUTYNIN CHLORIDE 5 MG PO TABS
5.0000 mg | ORAL_TABLET | ORAL | Status: AC
  Administered 2024-01-28: 5 mg via ORAL
  Filled 2024-01-27: qty 1

## 2024-01-27 MED ORDER — FLUCONAZOLE 150 MG PO TABS: 150.0000 mg | ORAL_TABLET | Freq: Every day | ORAL | 0 refills | Status: AC

## 2024-01-27 MED ORDER — NITROFURANTOIN MONOHYD MACRO 100 MG PO CAPS: 100.0000 mg | ORAL_CAPSULE | Freq: Two times a day (BID) | ORAL | 0 refills | Status: DC

## 2024-01-27 MED ORDER — LIDOCAINE-EPINEPHRINE (PF) 2 %-1:200000 IJ SOLN: 2.1000 mL | Freq: Once | INTRAMUSCULAR | Status: DC

## 2024-01-27 NOTE — ED Triage Notes (Signed)
 Currently being treated for uti and BV. Patient reports medication is not working. Patient endorses new onset of n/v, burning with urination, and right sided abdominal pain.

## 2024-01-28 MED ORDER — SULFAMETHOXAZOLE-TRIMETHOPRIM 800-160 MG PO TABS: 1.0000 | ORAL_TABLET | Freq: Two times a day (BID) | ORAL | 0 refills | Status: AC

## 2024-01-28 MED ORDER — LIDOCAINE HCL (PF) 1 % IJ SOLN
2.0000 mL | Freq: Once | INTRAMUSCULAR | Status: AC
  Administered 2024-01-28: 2 mL
  Filled 2024-01-28: qty 5

## 2024-01-28 NOTE — ED Provider Notes (Signed)
 [place] [facility] Provider Note   CSN: [id number] Arrival date & time: 01/27/24  2159     History  Chief Complaint  Patient presents with   Abdominal Pain   Dysuria    Priscilla Barajas is a 38 y.o. female.  The history is provided by the patient.  Abdominal Pain Pain radiates to:  Does not radiate Pain severity:  Severe Onset quality:  Gradual Timing:  Constant Progression:  Worsening Chronicity:  Recurrent Context: not trauma   Relieved by:  Nothing Associated symptoms: dysuria and fever   Associated symptoms: no vomiting   Risk factors: has not had multiple surgeries   Dysuria Associated symptoms: abdominal pain and fever   Associated symptoms: no vomiting   Patient with recurrent UTIs presents with ongoing symptoms on antibiotics.      Past Medical History:  Diagnosis Date   Asthma      Home Medications Prior to Admission medications   Medication Sig Start Date End Date Taking? Authorizing Provider  fluconazole (DIFLUCAN) 150 MG tablet Take 1 tablet (150 mg total) by mouth daily. Following antibiotics 01/27/24  Yes Rosamae Rocque, MD  oxybutynin (DITROPAN XL) 10 MG 24 hr tablet Take 1 tablet (10 mg total) by mouth at bedtime. 01/27/24  Yes Mary Hockey, MD  sulfamethoxazole-trimethoprim (BACTRIM DS) 800-160 MG tablet Take 1 tablet by mouth 2 (two) times daily for 7 days. 01/28/24 02/04/24 Yes Sarahy Creedon, MD  acetaminophen  (TYLENOL ) 325 MG tablet Take 2 tablets (650 mg total) by mouth every 6 (six) hours as needed. 11/30/23   Teddi Favors, DO  ALBUTEROL  IN Inhale into the lungs.    [provider]  fluticasone  (FLONASE ) 50 MCG/ACT nasal spray Place 1 spray into both nostrils daily. 04/20/18   Caccavale, Sophia, PA-C  omeprazole  (PRILOSEC) 20 MG capsule Take 1 capsule (20 mg total) by mouth daily. 11/06/15   Brolin Dambrosia, MD  ondansetron  (ZOFRAN ) 4 MG tablet Take 1 tablet (4 mg total) by mouth every 4 (four) hours  as needed for nausea or vomiting. 11/30/23   Teddi Favors, DO  promethazine -dextromethorphan (PROMETHAZINE -DM) 6.25-15 MG/5ML syrup Take 5 mLs by mouth 4 (four) times daily as needed for cough. 04/20/18   Caccavale, Sophia, PA-C      Allergies    Ketorolac , Tramadol , Clindamycin, Penicillins, Sulfamethoxazole-trimethoprim, and Clindamycin/lincomycin    Review of Systems   Review of Systems  Constitutional:  Positive for fever.  Gastrointestinal:  Positive for abdominal pain. Negative for vomiting.  Genitourinary:  Positive for dysuria.  All other systems reviewed and are negative.   Physical Exam Updated Vital Signs BP (!) 122/93 (BP Location: Left Arm)   Pulse 72   Temp 98.1 F (36.7 C) (Oral)   Resp 20   Ht 5' 2 (1.575 m)   Wt 68 kg   LMP 02/01/2014   SpO2 95%   BMI 27.44 kg/m  Physical Exam Vitals and nursing note reviewed.  Constitutional:      General: She is not in acute distress.    Appearance: Normal appearance. She is well-developed.  HENT:     Head: Normocephalic and atraumatic.     Nose: Nose normal.  Eyes:     Pupils: Pupils are equal, round, and reactive to light.  Cardiovascular:     Rate and Rhythm: Normal rate and regular rhythm.     Pulses: Normal pulses.     Heart sounds: Normal heart sounds.  Pulmonary:  Effort: Pulmonary effort is normal. No respiratory distress.     Breath sounds: Normal breath sounds.  Abdominal:     General: Bowel sounds are normal. There is no distension.     Palpations: Abdomen is soft.     Tenderness: There is no abdominal tenderness. There is no guarding or rebound.  Musculoskeletal:        General: Normal range of motion.     Cervical back: Neck supple.  Skin:    General: Skin is dry.     Capillary Refill: Capillary refill takes less than 2 seconds.     Findings: No erythema or rash.  Neurological:     General: No focal deficit present.     Mental Status: She is alert.     Deep Tendon Reflexes: Reflexes normal.   Psychiatric:        Mood and Affect: Mood normal.     ED Results / Procedures / Treatments   Labs (all labs ordered are listed, but only abnormal results are displayed) Results for orders placed or performed during the hospital encounter of 01/27/24  Urinalysis, Routine w reflex microscopic -Urine, Clean Catch   Collection Time: 01/27/24 10:06 PM  Result Value Ref Range   Color, Urine YELLOW YELLOW   APPearance HAZY (A) CLEAR   Specific Gravity, Urine 1.015 1.005 - 1.030   pH 6.0 5.0 - 8.0   Glucose, UA NEGATIVE NEGATIVE mg/dL   Hgb urine dipstick TRACE (A) NEGATIVE   Bilirubin Urine NEGATIVE NEGATIVE   Ketones, ur NEGATIVE NEGATIVE mg/dL   Protein, ur NEGATIVE NEGATIVE mg/dL   Nitrite POSITIVE (A) NEGATIVE   Leukocytes,Ua MODERATE (A) NEGATIVE  Urinalysis, Microscopic (reflex)   Collection Time: 01/27/24 10:06 PM  Result Value Ref Range   RBC / HPF 0-5 0 - 5 RBC/hpf   WBC, UA >50 0 - 5 WBC/hpf   Bacteria, UA MANY (A) NONE SEEN   Squamous Epithelial / HPF 0-5 0 - 5 /HPF  Lipase, blood   Collection Time: 01/27/24 10:07 PM  Result Value Ref Range   Lipase 20 11 - 51 U/L  Comprehensive metabolic panel   Collection Time: 01/27/24 10:07 PM  Result Value Ref Range   Sodium 139 135 - 145 mmol/L   Potassium 3.7 3.5 - 5.1 mmol/L   Chloride 103 98 - 111 mmol/L   CO2 26 22 - 32 mmol/L   Glucose, Bld 88 70 - 99 mg/dL   BUN 12 6 - 20 mg/dL   Creatinine, Ser 1.61 0.44 - 1.00 mg/dL   Calcium 9.1 8.9 - 09.6 mg/dL   Total Protein 7.2 6.5 - 8.1 g/dL   Albumin 4.5 3.5 - 5.0 g/dL   AST 23 15 - 41 U/L   ALT 35 0 - 44 U/L   Alkaline Phosphatase 98 38 - 126 U/L   Total Bilirubin <0.2 0.0 - 1.2 mg/dL   GFR, Estimated >04 >54 mL/min   Anion gap 11 5 - 15  CBC with Differential   Collection Time: 01/27/24 10:07 PM  Result Value Ref Range   WBC 14.0 (H) 4.0 - 10.5 K/uL   RBC 4.28 3.87 - 5.11 MIL/uL   Hemoglobin 12.5 12.0 - 15.0 g/dL   HCT 09.8 11.9 - 14.7 %   MCV 87.4 80.0 - 100.0  fL   MCH 29.2 26.0 - 34.0 pg   MCHC 33.4 30.0 - 36.0 g/dL   RDW 82.9 56.2 - 13.0 %   Platelets 265 150 - 400 K/uL  nRBC 0.0 0.0 - 0.2 %   Neutrophils Relative % 61 %   Neutro Abs 8.5 (H) 1.7 - 7.7 K/uL   Lymphocytes Relative 29 %   Lymphs Abs 4.1 (H) 0.7 - 4.0 K/uL   Monocytes Relative 7 %   Monocytes Absolute 1.0 0.1 - 1.0 K/uL   Eosinophils Relative 2 %   Eosinophils Absolute 0.3 0.0 - 0.5 K/uL   Basophils Relative 0 %   Basophils Absolute 0.1 0.0 - 0.1 K/uL   Immature Granulocytes 1 %   Abs Immature Granulocytes 0.07 0.00 - 0.07 K/uL   CT Renal Stone Study Result Date: 01/27/2024 CLINICAL DATA:  Nausea vomiting burning with urination EXAM: CT ABDOMEN AND PELVIS WITHOUT CONTRAST TECHNIQUE: Multidetector CT imaging of the abdomen and pelvis was performed following the standard protocol without IV contrast. RADIATION DOSE REDUCTION: This exam was performed according to the departmental dose-optimization program which includes automated exposure control, adjustment of the mA and/or kV according to patient size and/or use of iterative reconstruction technique. COMPARISON:  CT 11/29/2023, 01/22/2024, 02/03/2023 FINDINGS: Lower chest: Lung bases are clear Hepatobiliary: No focal liver abnormality is seen. Status post cholecystectomy. No biliary dilatation. Pancreas: Unremarkable. No pancreatic ductal dilatation or surrounding inflammatory changes. Spleen: Normal in size without focal abnormality. Adrenals/Urinary Tract: Adrenal glands are unremarkable. Kidneys are normal, without renal calculi, focal lesion, or hydronephrosis. Bladder is unremarkable. Stomach/Bowel: The stomach is nonenlarged. There is no dilated small bowel. No acute bowel wall thickening Vascular/Lymphatic: No significant vascular findings are present. No enlarged abdominal or pelvic lymph nodes. Reproductive: Prostate is unremarkable. Other: Negative for pelvic effusion or free air Musculoskeletal: Chronic bilateral pars defect  at L5. No acute osseous abnormality. IMPRESSION: 1. No CT evidence for acute intra-abdominal or pelvic abnormality. 2. Status post cholecystectomy. 3. Chronic bilateral pars defect at L5. Electronically Signed   By: Esmeralda Hedge M.D.   On: 01/27/2024 23:33    EKG None  Radiology CT Renal Stone Study Result Date: 01/27/2024 CLINICAL DATA:  Nausea vomiting burning with urination EXAM: CT ABDOMEN AND PELVIS WITHOUT CONTRAST TECHNIQUE: Multidetector CT imaging of the abdomen and pelvis was performed following the standard protocol without IV contrast. RADIATION DOSE REDUCTION: This exam was performed according to the departmental dose-optimization program which includes automated exposure control, adjustment of the mA and/or kV according to patient size and/or use of iterative reconstruction technique. COMPARISON:  CT 11/29/2023, 01/22/2024, 02/03/2023 FINDINGS: Lower chest: Lung bases are clear Hepatobiliary: No focal liver abnormality is seen. Status post cholecystectomy. No biliary dilatation. Pancreas: Unremarkable. No pancreatic ductal dilatation or surrounding inflammatory changes. Spleen: Normal in size without focal abnormality. Adrenals/Urinary Tract: Adrenal glands are unremarkable. Kidneys are normal, without renal calculi, focal lesion, or hydronephrosis. Bladder is unremarkable. Stomach/Bowel: The stomach is nonenlarged. There is no dilated small bowel. No acute bowel wall thickening Vascular/Lymphatic: No significant vascular findings are present. No enlarged abdominal or pelvic lymph nodes. Reproductive: Prostate is unremarkable. Other: Negative for pelvic effusion or free air Musculoskeletal: Chronic bilateral pars defect at L5. No acute osseous abnormality. IMPRESSION: 1. No CT evidence for acute intra-abdominal or pelvic abnormality. 2. Status post cholecystectomy. 3. Chronic bilateral pars defect at L5. Electronically Signed   By: Esmeralda Hedge M.D.   On: 01/27/2024 23:33     Procedures Procedures    Medications Ordered in ED Medications  cefTRIAXone  (ROCEPHIN ) injection 1 g (1 g Intramuscular Given 01/28/24 0006)  haloperidol lactate (HALDOL) injection 2 mg (2 mg Intramuscular Given 01/28/24 0006)  oxyCODONE (Oxy IR/ROXICODONE) immediate release tablet 5 mg (5 mg Oral Given 01/28/24 0008)  oxybutynin (DITROPAN) tablet 5 mg (5 mg Oral Given 01/28/24 0008)  lidocaine  (PF) (XYLOCAINE ) 1 % injection 2 mL (2 mLs Other Given 01/28/24 0006)    ED Course/ Medical Decision Making/ A&P                                 Medical Decision Making Patient with ongoing abdominal pain and UTi symptoms   Amount and/or Complexity of Data Reviewed External Data Reviewed: notes.    Details: Previous notes reviewed.  Patient is neither allergic to Bactrim, nor Toradol .  [facility] [place] [facility] [place] [facility] reviewed.  Labs: ordered.    Details: Urine is consistent with UTI. Lipase is normal 20.  White count slight elevation 14, normal hemoglobin 12.5, normal platelets.  Normal sodium 139, normal potassium 3.7, normal creatinine 0.68 Radiology: ordered and independent interpretation performed.    Details: Negative CT by me   Risk Prescription drug management. Risk Details: After discussion with Jummy, RPH.  Patient is not allergic to bactrim and has had this year successfully.  Patient has a culture that demonstrates this is the correct antibiotic.  Given Rocephin  here, will treat with bactrim and ditropan.  Patient is on home narcotic pain medication.  No signs of sepsis.  Stable for discharge     Final Clinical Impression(s) / ED Diagnoses Final diagnoses:  Acute cystitis without hematuria  No signs of systemic illness or infection. The patient is nontoxic-appearing on exam and vital signs are within normal limits.  I have reviewed the triage vital signs and the nursing notes. Pertinent labs & imaging results that were available during my care of the patient were  reviewed by me and considered in my medical decision making (see chart for details). After history, exam, and medical workup I feel the patient has been appropriately medically screened and is safe for discharge home. Pertinent diagnoses were discussed with the patient. Patient was given return precautions.       Rx / DC Orders      Aadon Gorelik, MD 01/28/24 [phone]

## 2024-04-05 ENCOUNTER — Emergency Department (HOSPITAL_BASED_OUTPATIENT_CLINIC_OR_DEPARTMENT_OTHER): Payer: PRIVATE HEALTH INSURANCE

## 2024-04-05 ENCOUNTER — Emergency Department (HOSPITAL_BASED_OUTPATIENT_CLINIC_OR_DEPARTMENT_OTHER)
Admission: EM | Admit: 2024-04-05 | Discharge: 2024-04-05 | Disposition: A | Discharge status: Home / Self Care | Payer: PRIVATE HEALTH INSURANCE | Attending: Emergency Medicine | Admitting: Emergency Medicine

## 2024-04-05 ENCOUNTER — Other Ambulatory Visit: Payer: Self-pay

## 2024-04-05 ENCOUNTER — Encounter (HOSPITAL_BASED_OUTPATIENT_CLINIC_OR_DEPARTMENT_OTHER): Payer: Self-pay | Admitting: Emergency Medicine

## 2024-04-05 DIAGNOSIS — N3000 Acute cystitis without hematuria: Secondary | ICD-10-CM | POA: Diagnosis not present

## 2024-04-05 DIAGNOSIS — R109 Unspecified abdominal pain: Secondary | ICD-10-CM | POA: Diagnosis present

## 2024-04-05 HISTORY — DX: Calculus of kidney: N20.0

## 2024-04-05 LAB — URINALYSIS, ROUTINE W REFLEX MICROSCOPIC
Bilirubin Urine: NEGATIVE
Glucose, UA: NEGATIVE mg/dL
Hgb urine dipstick: NEGATIVE
Ketones, ur: NEGATIVE mg/dL
Nitrite: POSITIVE — AB
Protein, ur: 30 mg/dL — AB
Specific Gravity, Urine: >=1.03 (ref 1.005–1.030)
pH: 6 (ref 5.0–8.0)

## 2024-04-05 LAB — BASIC METABOLIC PANEL WITH GFR
Anion gap: 13 (ref 5–15)
BUN: 10 mg/dL (ref 6–20)
CO2: 22 mmol/L (ref 22–32)
Calcium: 9.3 mg/dL (ref 8.9–10.3)
Chloride: 104 mmol/L (ref 98–111)
Creatinine, Ser: 0.74 mg/dL (ref 0.44–1.00)
GFR, Estimated: >60 mL/min (ref >60)
Glucose, Bld: 105 mg/dL — ABNORMAL HIGH (ref 70–99)
Potassium: 3.7 mmol/L (ref 3.5–5.1)
Sodium: 139 mmol/L (ref 135–145)

## 2024-04-05 LAB — CBC
HCT: 36.7 % (ref 36.0–46.0)
Hemoglobin: 12.2 g/dL (ref 12.0–15.0)
MCH: 29 pg (ref 26.0–34.0)
MCHC: 33.2 g/dL (ref 30.0–36.0)
MCV: 87.2 fL (ref 80.0–100.0)
Platelets: 242 K/uL (ref 150–400)
RBC: 4.21 MIL/uL (ref 3.87–5.11)
RDW: 13.2 % (ref 11.5–15.5)
WBC: 13.8 K/uL — ABNORMAL HIGH (ref 4.0–10.5)
nRBC: 0 % (ref 0.0–0.2)

## 2024-04-05 LAB — URINALYSIS, MICROSCOPIC (REFLEX)

## 2024-04-05 MED ORDER — SULFAMETHOXAZOLE-TRIMETHOPRIM 800-160 MG PO TABS
1.0000 | ORAL_TABLET | Freq: Once | ORAL | Status: AC
  Administered 2024-04-05: 1 via ORAL
  Filled 2024-04-05: qty 1

## 2024-04-05 MED ORDER — ONDANSETRON 4 MG PO TBDP: 4.0000 mg | ORAL_TABLET | Freq: Three times a day (TID) | ORAL | 0 refills | Status: DC | PRN

## 2024-04-05 MED ORDER — SULFAMETHOXAZOLE-TRIMETHOPRIM 800-160 MG PO TABS
1.0000 | ORAL_TABLET | Freq: Two times a day (BID) | ORAL | 0 refills | Status: AC
Start: 2024-04-05 — End: 2024-04-12

## 2024-04-05 NOTE — ED Provider Notes (Signed)
 [place] [facility] Provider Note   CSN: [id number] Arrival date & time: 04/05/24  1827     Patient presents with: Flank Pain   Priscilla Barajas is a 38 y.o. female.   Patient here with painful urination, pain in the right lower belly suprapubic spot.  History of kidney stones.  Denies any blood in the urine.  Denies any fevers or chills.  Nothing makes it worse or better.  Has felt nauseous but no vomiting.  No diarrhea.  No chest pain.  No vaginal discharge or bleeding.  The history is provided by the patient.       Prior to Admission medications   Medication Sig Start Date End Date Taking? Authorizing Provider  ondansetron  (ZOFRAN -ODT) 4 MG disintegrating tablet Take 1 tablet (4 mg total) by mouth every 8 (eight) hours as needed. 04/05/24  Yes Earma Nicolaou, DO  sulfamethoxazole -trimethoprim  (BACTRIM  DS) 800-160 MG tablet Take 1 tablet by mouth 2 (two) times daily for 7 days. 04/05/24 04/12/24 Yes [place] Hudman, DO  acetaminophen  (TYLENOL ) 325 MG tablet Take 2 tablets (650 mg total) by mouth every 6 (six) hours as needed. 11/30/23   Elnor Jayson LABOR, DO  ALBUTEROL  IN Inhale into the lungs.    [provider]  fluconazole  (DIFLUCAN ) 150 MG tablet Take 1 tablet (150 mg total) by mouth daily. Following antibiotics 01/27/24   Palumbo, April, MD  fluticasone  (FLONASE ) 50 MCG/ACT nasal spray Place 1 spray into both nostrils daily. 04/20/18   Caccavale, Sophia, PA-C  omeprazole  (PRILOSEC) 20 MG capsule Take 1 capsule (20 mg total) by mouth daily. 11/06/15   Palumbo, April, MD  ondansetron  (ZOFRAN ) 4 MG tablet Take 1 tablet (4 mg total) by mouth every 4 (four) hours as needed for nausea or vomiting. 11/30/23   Elnor Jayson LABOR, DO  oxybutynin  (DITROPAN  XL) 10 MG 24 hr tablet Take 1 tablet (10 mg total) by mouth at bedtime. 01/27/24   Palumbo, April, MD  promethazine -dextromethorphan (PROMETHAZINE -DM) 6.25-15 MG/5ML syrup Take 5 mLs by mouth 4 (four) times  daily as needed for cough. 04/20/18   Caccavale, Sophia, PA-C    Allergies: Ketorolac , Tramadol , Clindamycin, Penicillins, Sulfamethoxazole -trimethoprim , and Clindamycin/lincomycin    Review of Systems  Updated Vital Signs BP (!) 123/90 (BP Location: Right Arm)   Pulse 77   Temp 98.4 F (36.9 C) (Oral)   Resp 18   Ht 5' 2 (1.575 m)   Wt 68 kg   LMP 02/01/2014   SpO2 98%   BMI 27.44 kg/m   Physical Exam Vitals and nursing note reviewed.  Constitutional:      General: She is not in acute distress.    Appearance: She is well-developed.  HENT:     Head: Normocephalic and atraumatic.     Mouth/Throat:     Mouth: Mucous membranes are moist.  Eyes:     Extraocular Movements: Extraocular movements intact.     Conjunctiva/sclera: Conjunctivae normal.     Pupils: Pupils are equal, round, and reactive to light.  Cardiovascular:     Rate and Rhythm: Normal rate and regular rhythm.     Heart sounds: No murmur heard. Pulmonary:     Effort: Pulmonary effort is normal. No respiratory distress.     Breath sounds: Normal breath sounds.  Abdominal:     Palpations: Abdomen is soft.     Tenderness: There is abdominal tenderness.  Musculoskeletal:        General: No swelling.  Cervical back: Neck supple.  Skin:    General: Skin is warm and dry.     Capillary Refill: Capillary refill takes less than 2 seconds.  Neurological:     Mental Status: She is alert.  Psychiatric:        Mood and Affect: Mood normal.     (all labs ordered are listed, but only abnormal results are displayed) Labs Reviewed  URINALYSIS, ROUTINE W REFLEX MICROSCOPIC - Abnormal; Notable for the following components:      Result Value   APPearance CLOUDY (*)    Protein, ur 30 (*)    Nitrite POSITIVE (*)    Leukocytes,Ua SMALL (*)    All other components within normal limits  CBC - Abnormal; Notable for the following components:   WBC 13.8 (*)    All other components within normal limits  BASIC METABOLIC  PANEL WITH GFR - Abnormal; Notable for the following components:   Glucose, Bld 105 (*)    All other components within normal limits  URINALYSIS, MICROSCOPIC (REFLEX) - Abnormal; Notable for the following components:   Bacteria, UA MANY (*)    All other components within normal limits  URINE CULTURE    EKG: None  Radiology: CT Renal Stone Study Result Date: 04/05/2024 CLINICAL DATA:  Right-sided flank pain, urinary frequency and dysuria for 2 days EXAM: CT ABDOMEN AND PELVIS WITHOUT CONTRAST TECHNIQUE: Multidetector CT imaging of the abdomen and pelvis was performed following the standard protocol without IV contrast. RADIATION DOSE REDUCTION: This exam was performed according to the departmental dose-optimization program which includes automated exposure control, adjustment of the mA and/or kV according to patient size and/or use of iterative reconstruction technique. COMPARISON:  03/15/2024 FINDINGS: Lower chest: No acute pleural or parenchymal lung disease. Hepatobiliary: Cholecystectomy. Unremarkable unenhanced appearance of the liver. Pancreas: Unremarkable unenhanced appearance. Spleen: Unremarkable unenhanced appearance. Adrenals/Urinary Tract: No urinary tract calculi or obstructive uropathy within either kidney. The adrenals are unremarkable. The bladder is decompressed, limiting its evaluation. Stomach/Bowel: No bowel obstruction or ileus. The appendix, if still present, is not well visualized. No bowel wall thickening or inflammatory change. Vascular/Lymphatic: No significant vascular findings are present. No enlarged abdominal or pelvic lymph nodes. Reproductive: Status post hysterectomy. No adnexal masses. Other: No free fluid or free intraperitoneal gas. No abdominal wall hernia. Musculoskeletal: No acute or destructive bony abnormalities. Bilateral L5 spondylolysis without spondylolisthesis. Reconstructed images demonstrate no additional findings. IMPRESSION: 1. Unremarkable unenhanced  CT of the abdomen and pelvis. Electronically Signed   By: Ozell Daring M.D.   On: 04/05/2024 19:31     Procedures   Medications Ordered in the ED  sulfamethoxazole -trimethoprim  (BACTRIM  DS) 800-160 MG per tablet 1 tablet (has no administration in time range)                                    Medical Decision Making Amount and/or Complexity of Data Reviewed Labs: ordered. Radiology: ordered.  Risk Prescription drug management.   Priscilla Barajas is here with dysuria flank pain.  History of kidney stones.  Normal vitals.  No fever.  Per chart review looks like she has had some E. coli infections in the past with some drug resistance.  But amenable to Bactrim .  She does not have a true allergy to Bactrim  she states.  She had a CT scan done to evaluate for kidney stones basic labs urinalysis.  Differential diagnosis UTI versus  kidney stone versus less likely appendicitis colitis.  She looks very well.  Vitals are normal.  Urinalysis consistent with infection.  She has a mild white count of 13.8.  However CT scan showed no acute findings.  No appendicitis.  No kidney stone.  Electrolytes otherwise unremarkable per my review and interpretation labs and imaging.  Patient is given Bactrim  in the ED.  Will prescribe the same.  Will send the urine culture.  Sounds like she is responded well to Bactrim  in the past.  She understands strict return precautions.  Discharge.  This chart was dictated using voice recognition software.  Despite best efforts to proofread,  errors can occur which can change the documentation meaning.      Final diagnoses:  Acute cystitis without hematuria    ED Discharge Orders          Ordered    sulfamethoxazole -trimethoprim  (BACTRIM  DS) 800-160 MG tablet  2 times daily        04/05/24 2022    ondansetron  (ZOFRAN -ODT) 4 MG disintegrating tablet  Every 8 hours PRN        04/05/24 2022               Ruthe Cornet, DO 04/05/24 2024

## 2024-04-05 NOTE — Discharge Instructions (Signed)
 Take next dose of antibiotic tomorrow morning.  Return if symptoms worsen.  I given you Zofran  prescription for nausea as needed.  We will call you if we need to change antibiotics.

## 2024-04-05 NOTE — ED Notes (Signed)
 Lab called, to add urine culture to previously collected urine

## 2024-04-05 NOTE — ED Triage Notes (Signed)
 Pt c/o right sided flank pain with radiation to back, urinary frequency and pain with urination x 2 days, hx of kidney stones

## 2024-04-07 LAB — URINE CULTURE: Culture: >=100000 — AB

## 2024-04-08 ENCOUNTER — Telehealth (HOSPITAL_BASED_OUTPATIENT_CLINIC_OR_DEPARTMENT_OTHER): Payer: Self-pay

## 2024-04-08 NOTE — Progress Notes (Signed)
 ED Antimicrobial Stewardship Positive Culture Follow Up   Priscilla Barajas is an 38 y.o. female who presented to [facility] on 04/05/2024 with a chief complaint of  Chief Complaint  Patient presents with   Flank Pain    Recent Results (from the past 720 hours)  Urine Culture     Status: Abnormal   Collection Time: 04/05/24  8:22 PM   Specimen: Urine, Clean Catch  Result Value Ref Range Status   Specimen Description   Final    URINE, CLEAN CATCH Performed at [facility], [address] [place], [place] [address]    Special Requests   Final    NONE Performed at [facility], [address] [place], [place] [address]    Culture (A)  Final    >=100,000 COLONIES/mL ESCHERICHIA COLI Confirmed Extended Spectrum Beta-Lactamase Producer (ESBL).  In bloodstream infections from ESBL organisms, carbapenems are preferred over piperacillin/tazobactam. They are shown to have a lower risk of mortality.    Report Status 04/07/2024 FINAL  Final   Organism ID, Bacteria ESCHERICHIA COLI (A)  Final      Susceptibility   Escherichia coli - MIC*    AMPICILLIN >=32 RESISTANT Resistant     CEFAZOLIN (URINE) Value in next row Resistant      >=32 RESISTANTThis is a modified FDA-approved test that has been validated and its performance characteristics determined by the reporting laboratory.  This laboratory is certified under the Clinical Laboratory Improvement Amendments CLIA as qualified to perform high complexity clinical laboratory testing.    CEFEPIME Value in next row Resistant      >=32 RESISTANTThis is a modified FDA-approved test that has been validated and its performance characteristics determined by the reporting laboratory.  This laboratory is certified under the Clinical Laboratory Improvement Amendments CLIA as qualified to perform high complexity clinical laboratory testing.    ERTAPENEM Value in next row Sensitive      >=32 RESISTANTThis is a modified FDA-approved test  that has been validated and its performance characteristics determined by the reporting laboratory.  This laboratory is certified under the Clinical Laboratory Improvement Amendments CLIA as qualified to perform high complexity clinical laboratory testing.    CEFTRIAXONE  Value in next row Resistant      >=32 RESISTANTThis is a modified FDA-approved test that has been validated and its performance characteristics determined by the reporting laboratory.  This laboratory is certified under the Clinical Laboratory Improvement Amendments CLIA as qualified to perform high complexity clinical laboratory testing.    CIPROFLOXACIN Value in next row Resistant      >=32 RESISTANTThis is a modified FDA-approved test that has been validated and its performance characteristics determined by the reporting laboratory.  This laboratory is certified under the Clinical Laboratory Improvement Amendments CLIA as qualified to perform high complexity clinical laboratory testing.    GENTAMICIN Value in next row Resistant      >=32 RESISTANTThis is a modified FDA-approved test that has been validated and its performance characteristics determined by the reporting laboratory.  This laboratory is certified under the Clinical Laboratory Improvement Amendments CLIA as qualified to perform high complexity clinical laboratory testing.    NITROFURANTOIN  Value in next row Sensitive      >=32 RESISTANTThis is a modified FDA-approved test that has been validated and its performance characteristics determined by the reporting laboratory.  This laboratory is certified under the Clinical Laboratory Improvement Amendments CLIA as qualified to perform high complexity clinical laboratory testing.  TRIMETH /SULFA  Value in next row Resistant      >=32 RESISTANTThis is a modified FDA-approved test that has been validated and its performance characteristics determined by the reporting laboratory.  This laboratory is certified under the Clinical  Laboratory Improvement Amendments CLIA as qualified to perform high complexity clinical laboratory testing.    AMPICILLIN/SULBACTAM Value in next row Resistant      >=32 RESISTANTThis is a modified FDA-approved test that has been validated and its performance characteristics determined by the reporting laboratory.  This laboratory is certified under the Clinical Laboratory Improvement Amendments CLIA as qualified to perform high complexity clinical laboratory testing.    PIP/TAZO Value in next row Sensitive ug/mL     <=4 SENSITIVEThis is a modified FDA-approved test that has been validated and its performance characteristics determined by the reporting laboratory.  This laboratory is certified under the Clinical Laboratory Improvement Amendments CLIA as qualified to perform high complexity clinical laboratory testing.    MEROPENEM Value in next row Sensitive      <=4 SENSITIVEThis is a modified FDA-approved test that has been validated and its performance characteristics determined by the reporting laboratory.  This laboratory is certified under the Clinical Laboratory Improvement Amendments CLIA as qualified to perform high complexity clinical laboratory testing.    * >=100,000 COLONIES/mL ESCHERICHIA COLI    [x]  Treated with sulfamethoxazole /trimethoprim , organism resistant to prescribed antimicrobial  New antibiotic prescription: Nitrofurantoin  100 mg BID for 5 days  ED Provider: Larraine Christen, PA-C  Plan: - Stop Bactrim   - Start nitrofurantoin  100 mg BID for 5 days   Feliciano Close, PharmD PGY2 Infectious Diseases Pharmacy Resident  04/08/2024 9:22 AM

## 2024-04-08 NOTE — Telephone Encounter (Signed)
 Post ED Visit - Positive Culture Follow-up: Unsuccessful Patient Follow-up  Culture assessed and recommendations reviewed by:  []  Rankin Dee, Pharm.D. []  Venetia Gully, Pharm.D., BCPS AQ-ID []  Garrel Crews, Pharm.D., BCPS []  Almarie Lunger, Pharm.D., BCPS []  [place], [address].D., BCPS, AAHIVP []  Rosaline Bihari, Pharm.D., BCPS, AAHIVP []  Massie Rigg, PharmD []  Jodie Rower, PharmD, BCPS  Positive urine culture  []  Patient discharged without antimicrobial prescription and treatment is now indicated [x]  Organism is resistant to prescribed ED discharge antimicrobial []  Patient with positive blood cultures  Plan - stop Bactrim  and start Nitrofurantion 100 mg po BID for 5 days per ED provider Larraine Christen, PA-C   Unable to contact patient after 3 attempts, letter will be sent to address on file  Ruth Camelia Elbe 04/08/2024, 10:36 AM

## 2024-04-16 ENCOUNTER — Telehealth (HOSPITAL_BASED_OUTPATIENT_CLINIC_OR_DEPARTMENT_OTHER): Payer: Self-pay | Admitting: Emergency Medicine

## 2024-04-16 NOTE — Telephone Encounter (Signed)
 Post ED Visit - Positive Culture Follow-up: Successful Patient Follow-Up  Culture assessed and recommendations reviewed by:  []  Rankin Dee, Pharm.D. []  Venetia Gully, [address].D., BCPS AQ-ID []  Garrel Crews, Pharm.D., BCPS []  Almarie Lunger, Pharm.D., BCPS []  [place], [address].D., BCPS, AAHIVP []  Rosaline Bihari, Pharm.D., BCPS, AAHIVP []  Vernell Meier, PharmD, BCPS []  Latanya Hint, PharmD, BCPS []  Donald Medley, PharmD, BCPS []  Rocky Bold, PharmD  Positive urine culture  []  Patient discharged without antimicrobial prescription and treatment is now indicated [x]  Organism is resistant to prescribed ED discharge antimicrobial []  Patient with positive blood cultures  Changes discussed with ED provider: Larraine Christen PA New antibiotic prescription Nitrofurantoin  100 mg BID for five days Called to [facility] [phone]  Contacted patient, date 04/16/2024, time 1550   Teale Goodgame C Alixandria Friedt 04/16/2024, 3:58 PM

## 2024-04-24 NOTE — ED Provider Notes (Signed)
 ------------------------------------------------------------------------------- Attestation signed by Almarie Cross, MD at 04/25/24 0124 I attest that I am the attending physician for this patient. I have not seen the patient but was available for discussion of the medical decision making with the advanced care practitioner. I have reviewed the treatment plan as described in the provider notes.   -------------------------------------------------------------------------------   [facility]  ED Provider Note  Priscilla Barajas 38 y.o. female DOB: 11-Apr-1986 MRN: [id number] History   Chief Complaint  Patient presents with  . Neck Pain    Pt reports R sided neck pain that radiates to R shoulder over the last couple of days. Denies injury.   HX: gallbladder removal    HPI History of Present Illness This is a patient presenting with neck pain.  The patient began experiencing neck pain 2 days ago, initially attributing it to an uncomfortable sleeping position. The pain has since intensified and now extends down her arm, causing significant discomfort when lifting it. She describes the pain as worsening with movement of her arm and turning of her head. She reports no numbness or weakness. This is her first encounter with such symptoms. Additionally, she mentions soreness in her shoulder joint and mild chest pain, but no pain in the middle of her chest. She has been managing the pain with Tylenol  and ibuprofen, and took Goody's Powder at 5:00 PM today. She confirms that she is not currently pregnant or breastfeeding, and does not have diabetes.    Past Medical History:  Diagnosis Date  . Asthma (*)   . UTI (urinary tract infection)     Past Surgical History:  Procedure Laterality Date  . Bladder surgery    . Hysterectomy     parital  . Ovarian cyst drainage    . 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 Spine surgery     removed tumor     Social History   Substance and Sexual Activity  Alcohol Use Yes  . Alcohol/week: 0.0 - 1.0 standard drinks of alcohol   Comment: occ.   Tobacco Use History[1] E-Cigarettes  . Vaping Use Never User   . Start Date    . Cartridges/Day    . Quit Date     Social History   Substance and Sexual Activity  Drug Use No         Allergies[2]  Home Medications   ALBUTEROL  SULFATE HFA (PROVENTIL ,VENTOLIN ,PROAIR ) 108 (90 BASE) MCG/ACT INHALER    Inhale one puff to two puffs into the lungs every 6 (six) hours as needed for Wheezing.   FLUTICASONE -SALMETEROL (ADVAIR HFA) 45-21 MCG/ACTUATION INHALER    Inhale two puffs into the lungs 2 (two) times daily.   NALOXONE (NARCAN) NASAL SPRAY (TAKE HOME PACK)    one spray by Intranasal route as needed for Opioid Reversal for up to 1 dose.   NAPROXEN (NAPROSYN) 500 MG TABLET    Take one tablet (500 mg dose) by mouth 2 (two) times a day as needed (pain) for up to 10 days.   ONDANSETRON  (ZOFRAN -ODT) 4 MG DISINTEGRATING TABLET    Take one tablet (4 mg dose) by mouth every 8 (eight) hours as needed for Nausea.   SENNA CO    TAKE 1 TABLET BY MOUTH TWICE A DAY    Primary Survey  Primary Survey  Review of Systems   Review of Systems  Physical Exam   ED Triage Vitals [04/24/24 2034]  BP 143/82  Heart Rate  69  Resp 20  SpO2 100 %  Temp 98.1 F (36.7 C)    Physical Exam Physical Exam Nursing note and vitals reviewed. Constitutional: She appears well-developed and well-nourished.  Head: Normocephalic and atraumatic.  Eyes: EOM are intact.  Cardiovascular: Normal rate and regular rhythm.  Pulmonary/Chest: Respiratory effort normal Neck: Right paraspinal cervical trapezius area exhibits muscle tightness and tenderness.  No midline cervical tenderness Musculoskeletal: No tenderness of right shoulder, elbow, wrist.  Full range of motion at shoulder and elbow joint although increased cervicalgia  ED Course   Lab results: No data to  display  Imaging: No data to display   ECG: ECG Results   None                                                                        Pre-Sedation Procedures    Medical Decision Making Risk Prescription drug management.   Results   Assessment & Plan Initial Assessment: Neck pain radiating down the arm, worsens with movement. No numbness or weakness. Pain likely due to muscle spasms.  Without any bony tenderness low suspicion of fracture especially given atraumatic  Differential Diagnosis: - Muscle spasms: Tight muscles, pain radiating down arm, worsens with movement. Plan: Muscle relaxer prescribed, injection for immediate relief. - Bone fracture: No pain in bone, unlikely. Plan: No further action.  ED Course: Injection administered for immediate relief.  Did have significant relief with muscle relaxants and anti-inflammatories will recommend same at home.  Follow-up with primary.  Final Assessment: Injection administered for immediate relief. Muscle relaxer prescribed for spasms. No evidence of bone fracture.  Clinical Impression: - Muscle spasms  Disposition: - Discharge: Home. Return if symptoms worsen or new symptoms develop.     Provider Communication  New Prescriptions   IBUPROFEN (ADVIL,MOTRIN) 800 MG TABLET    Take one tablet (800 mg dose) by mouth 3 (three) times a day.      Quantity: 20 tablet    Refills: 0   METHOCARBAMOL (ROBAXIN) 500 MG TABLET    Take one tablet (500 mg dose) by mouth 2 (two) times daily for 10 days.      Quantity: 20 tablet    Refills: 0    Modified Medications   No medications on file    Discontinued Medications   No medications on file    Clinical Impression Final diagnoses:  Neck muscle spasm    ED Disposition     ED Disposition  Discharge   Condition  Stable   Comment  --                 Follow-up Information     Schedule an appointment as soon as  possible for a visit  with [facility].   Comments: please call to establish if you do not already have a primary care Contact information: [address], [address] [place]  [address] [phone]                 Electronically signed by:       [1] Social History Tobacco Use  Smoking Status Every Day  . Current packs/day: 0.50  . Average packs/day: 0.5 packs/day for 2.0 years (1.0 ttl pk-yrs)  .  Types: Cigarettes  . Passive exposure: Never  Smokeless Tobacco Never  [2] Allergies Allergen Reactions  . Clindamycin Hives    Felt like throat closing  . Penicillins Rash    Pt reports this only gives her a yeast infection. Pt has recently taken a dose with no reactions noted.  . Sulfamethoxazole -Trimethoprim  Rash   Alyce JONETTA Mater, PA-C 04/24/24 2148

## 2024-05-11 ENCOUNTER — Other Ambulatory Visit: Payer: Self-pay

## 2024-05-11 ENCOUNTER — Encounter (HOSPITAL_BASED_OUTPATIENT_CLINIC_OR_DEPARTMENT_OTHER): Payer: Self-pay | Admitting: Urology

## 2024-05-11 ENCOUNTER — Emergency Department (HOSPITAL_BASED_OUTPATIENT_CLINIC_OR_DEPARTMENT_OTHER)
Admission: EM | Admit: 2024-05-11 | Discharge: 2024-05-11 | Disposition: A | Discharge status: Home / Self Care | Attending: Emergency Medicine | Admitting: Emergency Medicine

## 2024-05-11 DIAGNOSIS — R350 Frequency of micturition: Secondary | ICD-10-CM | POA: Insufficient documentation

## 2024-05-11 DIAGNOSIS — Z7951 Long term (current) use of inhaled steroids: Secondary | ICD-10-CM | POA: Diagnosis not present

## 2024-05-11 DIAGNOSIS — R1030 Lower abdominal pain, unspecified: Secondary | ICD-10-CM | POA: Diagnosis not present

## 2024-05-11 DIAGNOSIS — J45909 Unspecified asthma, uncomplicated: Secondary | ICD-10-CM | POA: Diagnosis not present

## 2024-05-11 LAB — URINALYSIS, ROUTINE W REFLEX MICROSCOPIC
Bilirubin Urine: NEGATIVE
Glucose, UA: NEGATIVE mg/dL
Ketones, ur: NEGATIVE mg/dL
Nitrite: NEGATIVE
Protein, ur: NEGATIVE mg/dL
Specific Gravity, Urine: 1.02 (ref 1.005–1.030)
pH: 7.5 (ref 5.0–8.0)

## 2024-05-11 LAB — BASIC METABOLIC PANEL WITH GFR
Anion gap: 11 (ref 5–15)
BUN: 13 mg/dL (ref 6–20)
CO2: 24 mmol/L (ref 22–32)
Calcium: 8.9 mg/dL (ref 8.9–10.3)
Chloride: 104 mmol/L (ref 98–111)
Creatinine, Ser: 0.74 mg/dL (ref 0.44–1.00)
GFR, Estimated: >60 mL/min (ref >60)
Glucose, Bld: 99 mg/dL (ref 70–99)
Potassium: 3.8 mmol/L (ref 3.5–5.1)
Sodium: 139 mmol/L (ref 135–145)

## 2024-05-11 LAB — CBC
HCT: 35.5 % — ABNORMAL LOW (ref 36.0–46.0)
Hemoglobin: 11.9 g/dL — ABNORMAL LOW (ref 12.0–15.0)
MCH: 29.2 pg (ref 26.0–34.0)
MCHC: 33.5 g/dL (ref 30.0–36.0)
MCV: 87.2 fL (ref 80.0–100.0)
Platelets: 276 K/uL (ref 150–400)
RBC: 4.07 MIL/uL (ref 3.87–5.11)
RDW: 12.9 % (ref 11.5–15.5)
WBC: 11.6 K/uL — ABNORMAL HIGH (ref 4.0–10.5)
nRBC: 0 % (ref 0.0–0.2)

## 2024-05-11 LAB — URINALYSIS, MICROSCOPIC (REFLEX)

## 2024-05-11 MED ORDER — HYDROCODONE-ACETAMINOPHEN 5-325 MG PO TABS
1.0000 | ORAL_TABLET | Freq: Once | ORAL | Status: AC
  Administered 2024-05-11: 1 via ORAL
  Filled 2024-05-11: qty 1

## 2024-05-11 MED ORDER — CELECOXIB 200 MG PO CAPS: 200.0000 mg | ORAL_CAPSULE | Freq: Two times a day (BID) | ORAL | 0 refills | Status: DC | PRN

## 2024-05-11 MED ORDER — CYCLOBENZAPRINE HCL 10 MG PO TABS: 10.0000 mg | ORAL_TABLET | Freq: Two times a day (BID) | ORAL | 0 refills | Status: DC | PRN

## 2024-05-11 MED ORDER — CYCLOBENZAPRINE HCL 10 MG PO TABS
10.0000 mg | ORAL_TABLET | Freq: Once | ORAL | Status: AC
  Administered 2024-05-11: 10 mg via ORAL
  Filled 2024-05-11: qty 1

## 2024-05-11 MED ORDER — FOSFOMYCIN TROMETHAMINE 3 G PO PACK
3.0000 g | PACK | Freq: Once | ORAL | Status: AC
  Administered 2024-05-11: 3 g via ORAL
  Filled 2024-05-11: qty 3

## 2024-05-11 NOTE — Discharge Instructions (Addendum)
 As discussed, we have treated with fosfomycin while in the emergency department.  This should cover you from a urinary perspective.  Will culture urine to see what grows out.  Recommend follow-up with primary care for reassessment.

## 2024-05-11 NOTE — ED Provider Notes (Signed)
 [facility] Provider Note   CSN: [id number] Arrival date & time: 05/11/24  2120     Patient presents with: No chief complaint on file.   WESTLYN GLAZA is a 38 y.o. female.   HPI   38 year old female presents emergency department with complaints of right flank pain.  Right flank pain began a couple days ago.  Has been trying over-the-counter medications without significant improvement prompting visit to the emergency department.  Does report some increased urinary frequency.  Denies any hematuria, fevers, chills, nausea, vomiting.  Does report having a partial hysterectomy still has her right ovary but had everything else removed.  Does report cholecystectomy.  Per chart review, patient has had 3 CT scans of the abdomen in the past 5 months.  Past medical history significant for asthma, kidney stone, pyelonephritis, recurrent UTI  Prior to Admission medications   Medication Sig Start Date End Date Taking? Authorizing Provider  acetaminophen  (TYLENOL ) 325 MG tablet Take 2 tablets (650 mg total) by mouth every 6 (six) hours as needed. 11/30/23   Elnor Jayson LABOR, DO  ALBUTEROL  IN Inhale into the lungs.    [provider]  fluconazole  (DIFLUCAN ) 150 MG tablet Take 1 tablet (150 mg total) by mouth daily. Following antibiotics 01/27/24   Palumbo, April, MD  fluticasone  (FLONASE ) 50 MCG/ACT nasal spray Place 1 spray into both nostrils daily. 04/20/18   Caccavale, Sophia, PA-C  omeprazole  (PRILOSEC) 20 MG capsule Take 1 capsule (20 mg total) by mouth daily. 11/06/15   Palumbo, April, MD  ondansetron  (ZOFRAN ) 4 MG tablet Take 1 tablet (4 mg total) by mouth every 4 (four) hours as needed for nausea or vomiting. 11/30/23   Elnor Jayson A, DO  ondansetron  (ZOFRAN -ODT) 4 MG disintegrating tablet Take 1 tablet (4 mg total) by mouth every 8 (eight) hours as needed. 04/05/24   Curatolo, Adam, DO  oxybutynin  (DITROPAN  XL) 10 MG 24 hr tablet Take 1 tablet  (10 mg total) by mouth at bedtime. 01/27/24   Palumbo, April, MD  promethazine -dextromethorphan (PROMETHAZINE -DM) 6.25-15 MG/5ML syrup Take 5 mLs by mouth 4 (four) times daily as needed for cough. 04/20/18   Caccavale, Sophia, PA-C    Allergies: Ketorolac , Tramadol , Clindamycin, Penicillins, Sulfamethoxazole -trimethoprim , and Clindamycin/lincomycin    Review of Systems  All other systems reviewed and are negative.   Updated Vital Signs LMP 02/01/2014   Physical Exam Vitals and nursing note reviewed.  Constitutional:      General: She is not in acute distress.    Appearance: She is well-developed.  HENT:     Head: Normocephalic and atraumatic.  Eyes:     Conjunctiva/sclera: Conjunctivae normal.  Cardiovascular:     Rate and Rhythm: Normal rate and regular rhythm.     Heart sounds: No murmur heard. Pulmonary:     Effort: Pulmonary effort is normal. No respiratory distress.     Breath sounds: Normal breath sounds.  Abdominal:     Palpations: Abdomen is soft.     Tenderness: There is right CVA tenderness.     Comments: Mild tenderness lower quadrant of abdomen  Musculoskeletal:        General: No swelling.     Cervical back: Neck supple.  Skin:    General: Skin is warm and dry.     Capillary Refill: Capillary refill takes less than 2 seconds.  Neurological:     Mental Status: She is alert.  Psychiatric:        Mood  and Affect: Mood normal.     (all labs ordered are listed, but only abnormal results are displayed) Labs Reviewed - No data to display  EKG: None  Radiology: No results found.   Procedures   Medications Ordered in the ED - No data to display                                  Medical Decision Making Amount and/or Complexity of Data Reviewed Labs: ordered.  Risk Prescription drug management.   This patient presents to the ED for concern of flank pain, this involves an extensive number of treatment options, and is a complaint that carries with it  a high risk of complications and morbidity.  The differential diagnosis includes MSK, pyelonephritis, nephrolithiasis, appendicitis.  Torsion, or limb   Co morbidities that complicate the patient evaluation  See HPI   Additional history obtained:  Additional history obtained from EMR External records from outside source obtained and reviewed including hospital records   Lab Tests:  I Ordered, and personally interpreted labs.  The pertinent results include: Leukocytosis 11.6.  Anemia hemoglobin 11.9.  Platelets within range.  No Electra abnormalities.  No renal dysfunction.  UA with small leukocytes, many bacteria, 11-20 RBCs.  Urine culture pending.   Imaging Studies ordered:  N/a   Cardiac Monitoring: / EKG:  The patient was maintained on a cardiac monitor.  I personally viewed and interpreted the cardiac monitored which showed an underlying rhythm of: sinus rhythm   Consultations Obtained:  Consulted with attending Dr. Emil who is in agreement treatment plan going forward  Problem List / ED Course / Critical interventions / Medication management  Frequency I ordered medication including open the flexeril , fosfomycin Reevaluation of the patient after these medicines showed that the patient improved I have reviewed the patients home medicines and have made adjustments as needed   Social Determinants of Health:  Chronic cigarette use.  Denies illicit drug use.   Test / Admission - Considered:  Urinary frequency Vitals signs significant for hypertension blood pressure 151/92. Otherwise within normal range and stable throughout visit. Laboratory/imaging studies significant for: See above 38 year old female presents emergency department with complaints of right flank pain.  Right flank pain began a couple days ago.  Has been trying over-the-counter medications without significant improvement prompting visit to the emergency department.  Does report some increased  urinary frequency.  Denies any hematuria, fevers, chills, nausea, vomiting.  Does report having a partial hysterectomy still has her right ovary but had everything else removed.  Does report cholecystectomy.  Per chart review, patient has had 3 CT scans of the abdomen in the past 5 months. On exam, possible left-sided CVA tenderness versus MSK.  Labs concerning for mild leukocytosis of 11.6.  UA with bacteria, leukocyte, WBCs present but otherwise unremarkable.  Given patient's multiple CT scans in the past handful of months, CT imaging was deferred given appearance of workup.  Patient's urine concerning for possible urinary tract infection.  Has had ESBL in the past.  Consulted attending regarding the case and recommended dose of fosfomycin, culture urine, follow-up outpatient.  Treatment plan discussed with patient and she acknowledged understanding was agreeable.  Patient well-appearing, afebrile in no acute distress upon discharge.  Worrisome signs and symptoms were discussed with the patient, and the patient acknowledged understanding to return to the ED if noticed. Patient was stable upon discharge.  Final diagnoses:  None    ED Discharge Orders     None          Silver Wonda LABOR, [place] 05/11/24 2250    Emil Share, DO 05/11/24 2300

## 2024-05-11 NOTE — ED Triage Notes (Signed)
 Right sided flank pian worsening today, state urinary frequency and possible hematuria, denies N/V.  Normal BM

## 2024-05-14 LAB — URINE CULTURE: Culture: >=100000 — AB

## 2024-05-15 ENCOUNTER — Telehealth (HOSPITAL_BASED_OUTPATIENT_CLINIC_OR_DEPARTMENT_OTHER): Payer: Self-pay

## 2024-05-15 NOTE — Telephone Encounter (Signed)
 Post ED Visit - Positive Culture Follow-up  Culture report reviewed by antimicrobial stewardship pharmacist: [facility] [x]  Dorn Buttner, Pharm.D. []  Venetia Gully, Pharm.D., BCPS AQ-ID []  Garrel Crews, Pharm.D., BCPS []  Almarie Lunger, [address].D., BCPS []  [place], [address].D., BCPS, AAHIVP []  Rosaline Bihari, Pharm.D., BCPS, AAHIVP []  Vernell Meier, PharmD, BCPS []  Latanya Hint, PharmD, BCPS []  Donald Medley, PharmD, BCPS []  Rocky Bold, PharmD []  Dorothyann Alert, PharmD, BCPS []  Morene Babe, PharmD  [facility] []  Rosaline Edison, PharmD []  Romona Bliss, PharmD []  Dolphus Roller, PharmD []  Veva Seip, Rph []  Vernell Daunt) Leonce, PharmD []  Eva Allis, PharmD []  Rosaline Millet, PharmD []  Iantha Batch, PharmD []  Arvin Gauss, PharmD []  Wanda Hasting, PharmD []  Ronal Rav, PharmD []  Rocky Slade, PharmD []  Bard Jeans, PharmD   Positive urine culture Treated with Fosfomycin, organism sensitive to the same and no further patient follow-up is required at this time.  Ruth Camelia Elbe 05/15/2024, 3:46 PM

## 2024-06-21 ENCOUNTER — Other Ambulatory Visit: Payer: Self-pay

## 2024-06-21 ENCOUNTER — Emergency Department (HOSPITAL_BASED_OUTPATIENT_CLINIC_OR_DEPARTMENT_OTHER)
Admission: EM | Admit: 2024-06-21 | Discharge: 2024-06-21 | Disposition: A | Discharge status: Home / Self Care | Payer: PRIVATE HEALTH INSURANCE | Attending: Emergency Medicine | Admitting: Emergency Medicine

## 2024-06-21 ENCOUNTER — Encounter (HOSPITAL_BASED_OUTPATIENT_CLINIC_OR_DEPARTMENT_OTHER): Payer: Self-pay | Admitting: Emergency Medicine

## 2024-06-21 ENCOUNTER — Emergency Department (HOSPITAL_BASED_OUTPATIENT_CLINIC_OR_DEPARTMENT_OTHER): Payer: PRIVATE HEALTH INSURANCE

## 2024-06-21 DIAGNOSIS — M79672 Pain in left foot: Secondary | ICD-10-CM | POA: Insufficient documentation

## 2024-06-21 MED ORDER — ACETAMINOPHEN 500 MG PO TABS
1000.0000 mg | ORAL_TABLET | Freq: Once | ORAL | Status: AC
Start: 2024-06-21 — End: 2024-06-21
  Administered 2024-06-21: 1000 mg via ORAL
  Filled 2024-06-21: qty 2

## 2024-06-21 MED ORDER — LIDOCAINE 5 % EX PTCH: 1.0000 | MEDICATED_PATCH | CUTANEOUS | 0 refills | Status: DC

## 2024-06-21 MED ORDER — LIDOCAINE 5 % EX PTCH: 2.0000 | MEDICATED_PATCH | CUTANEOUS | Status: DC

## 2024-06-21 NOTE — ED Provider Notes (Signed)
 [place] [facility] AT [facility] [place] Provider Note   CSN: [id number] Arrival date & time: 06/21/24  0036     Patient presents with: Foot Pain   Priscilla Barajas is a 38 y.o. female.   The history is provided by the patient.  Foot Pain This is a new problem. The current episode started 6 to 12 hours ago. The problem occurs constantly. The problem has not changed since onset.Pertinent negatives include no chest pain, no abdominal pain, no headaches and no shortness of breath. Nothing aggravates the symptoms. Nothing relieves the symptoms. She has tried nothing for the symptoms. The treatment provided no relief.  No known trauma.       Prior to Admission medications   Medication Sig Start Date End Date Taking? Authorizing Provider  lidocaine  (LIDODERM ) 5 % Place 1 patch onto the skin daily. Remove & Discard patch within 12 hours or as directed by MD 06/21/24  Yes Berton Butrick, MD  acetaminophen  (TYLENOL ) 325 MG tablet Take 2 tablets (650 mg total) by mouth every 6 (six) hours as needed. 11/30/23   Elnor Jayson LABOR, DO  ALBUTEROL  IN Inhale into the lungs.    [provider]  celecoxib  (CELEBREX ) 200 MG capsule Take 1 capsule (200 mg total) by mouth 2 (two) times daily as needed. 05/11/24   Silver Wonda LABOR, PA  cyclobenzaprine  (FLEXERIL ) 10 MG tablet Take 1 tablet (10 mg total) by mouth 2 (two) times daily as needed for muscle spasms. 05/11/24   Silver Wonda LABOR, PA  fluconazole  (DIFLUCAN ) 150 MG tablet Take 1 tablet (150 mg total) by mouth daily. Following antibiotics 01/27/24   Onyx Edgley, MD  fluticasone  (FLONASE ) 50 MCG/ACT nasal spray Place 1 spray into both nostrils daily. 04/20/18   Caccavale, Sophia, PA-C  omeprazole  (PRILOSEC) 20 MG capsule Take 1 capsule (20 mg total) by mouth daily. 11/06/15   Kassadi Presswood, MD  ondansetron  (ZOFRAN ) 4 MG tablet Take 1 tablet (4 mg total) by mouth every 4 (four) hours as needed for nausea or vomiting. 11/30/23   Elnor Jayson LABOR, DO  ondansetron  (ZOFRAN -ODT) 4 MG disintegrating tablet Take 1 tablet (4 mg total) by mouth every 8 (eight) hours as needed. 04/05/24   Curatolo, Adam, DO  oxybutynin  (DITROPAN  XL) 10 MG 24 hr tablet Take 1 tablet (10 mg total) by mouth at bedtime. 01/27/24   Angad Nabers, MD  promethazine -dextromethorphan (PROMETHAZINE -DM) 6.25-15 MG/5ML syrup Take 5 mLs by mouth 4 (four) times daily as needed for cough. 04/20/18   Caccavale, Sophia, PA-C    Allergies: Ketorolac , Tramadol , Clindamycin, Penicillins, Sulfamethoxazole -trimethoprim , and Clindamycin/lincomycin    Review of Systems  Respiratory:  Negative for shortness of breath.   Cardiovascular:  Negative for chest pain.  Gastrointestinal:  Negative for abdominal pain.  Musculoskeletal:  Positive for arthralgias.  Neurological:  Negative for headaches.  All other systems reviewed and are negative.   Updated Vital Signs BP 123/72   Pulse 60   Temp 98.5 F (36.9 C) (Oral)   Resp 18   Ht 5' 2 (1.575 m)   Wt 70.3 kg   LMP 02/01/2014   SpO2 100%   BMI 28.35 kg/m   Physical Exam Vitals and nursing note reviewed.  Constitutional:      General: She is not in acute distress.    Appearance: Normal appearance. She is well-developed.  HENT:     Head: Normocephalic and atraumatic.     Nose: Nose normal.  Eyes:  Pupils: Pupils are equal, round, and reactive to light.  Cardiovascular:     Rate and Rhythm: Normal rate and regular rhythm.     Pulses: Normal pulses.     Heart sounds: Normal heart sounds.  Pulmonary:     Effort: Pulmonary effort is normal. No respiratory distress.     Breath sounds: Normal breath sounds.  Abdominal:     General: Bowel sounds are normal. There is no distension.     Palpations: Abdomen is soft.     Tenderness: There is no abdominal tenderness. There is no guarding or rebound.  Musculoskeletal:        General: No tenderness, deformity or signs of injury. Normal range of motion.     Cervical  back: Normal range of motion and neck supple.     Right ankle: Normal.     Right Achilles Tendon: Normal.     Left ankle: Normal.     Left Achilles Tendon: Normal.     Left foot: Normal range of motion and normal capillary refill. No swelling, deformity, bunion, Charcot foot, foot drop, prominent metatarsal heads, bony tenderness or crepitus. Normal pulse.  Skin:    General: Skin is warm and dry.     Capillary Refill: Capillary refill takes less than 2 seconds.     Findings: No erythema or rash.  Neurological:     General: No focal deficit present.     Mental Status: She is alert and oriented to person, place, and time.     Deep Tendon Reflexes: Reflexes normal.  Psychiatric:        Mood and Affect: Mood normal.     (all labs ordered are listed, but only abnormal results are displayed) Labs Reviewed - No data to display  EKG: None  Radiology: DG Foot Complete Left Result Date: 06/21/2024 CLINICAL DATA:  Status post trauma. EXAM: LEFT FOOT - COMPLETE 3+ VIEW COMPARISON:  February 25, 2018 FINDINGS: There is no evidence of fracture or dislocation. There is no evidence of arthropathy or other focal bone abnormality. Soft tissues are unremarkable. IMPRESSION: Negative. Electronically Signed   By: Suzen Dials M.D.   On: 06/21/2024 01:30     Procedures   Medications Ordered in the ED  lidocaine  (LIDODERM ) 5 % 2 patch (has no administration in time range)                                    Medical Decision Making Patient with foot pain for 12 hours   Amount and/or Complexity of Data Reviewed Independent Historian: spouse    Details: See above  External Data Reviewed: notes.    Details: Previous notes reviewed  Radiology: ordered and independent interpretation performed.    Details: Normal Xray  Risk OTC drugs. Prescription drug management. Risk Details: Well appearing with normal exam.  Skin is normal. Pulses normal.  No known trauma.  Tylenol  for pain.  Will add  lidoderm .  Ice for 20 minutes every 2 hours for 2 days.  Stable for discharge.       Final diagnoses:  Foot pain, left    The patient is nontoxic-appearing on exam and vital signs are within normal limits.  I have reviewed the triage vital signs and the nursing notes. Pertinent labs & imaging results that were available during my care of the patient were reviewed by me and considered in my medical decision making (see chart for  details). After history, exam, and medical workup I feel the patient has been appropriately medically screened and is safe for discharge home. Pertinent diagnoses were discussed with the patient. Patient was given return precautions.    ED Discharge Orders          Ordered    lidocaine  (LIDODERM ) 5 %  Every 24 hours        06/21/24 0208               Cricket Goodlin, MD 06/21/24 0245

## 2024-06-21 NOTE — ED Triage Notes (Signed)
 L foot pain that started ~12 hours ago. Unsure of injury. States she helped someone move yesterday. Pt is ambulatory with limp without assistance. No deformity noted to foot.

## 2024-08-15 ENCOUNTER — Emergency Department (HOSPITAL_BASED_OUTPATIENT_CLINIC_OR_DEPARTMENT_OTHER)

## 2024-08-15 ENCOUNTER — Other Ambulatory Visit: Payer: Self-pay

## 2024-08-15 ENCOUNTER — Emergency Department (HOSPITAL_BASED_OUTPATIENT_CLINIC_OR_DEPARTMENT_OTHER)
Admission: EM | Admit: 2024-08-15 | Discharge: 2024-08-15 | Disposition: A | Discharge status: Home / Self Care | Attending: Emergency Medicine | Admitting: Emergency Medicine

## 2024-08-15 ENCOUNTER — Encounter (HOSPITAL_BASED_OUTPATIENT_CLINIC_OR_DEPARTMENT_OTHER): Payer: Self-pay | Admitting: Emergency Medicine

## 2024-08-15 DIAGNOSIS — R10A1 Flank pain, right side: Secondary | ICD-10-CM | POA: Diagnosis present

## 2024-08-15 DIAGNOSIS — N3000 Acute cystitis without hematuria: Secondary | ICD-10-CM | POA: Diagnosis not present

## 2024-08-15 DIAGNOSIS — D72829 Elevated white blood cell count, unspecified: Secondary | ICD-10-CM | POA: Insufficient documentation

## 2024-08-15 LAB — CBC
HCT: 37.4 % (ref 36.0–46.0)
Hemoglobin: 12.4 g/dL (ref 12.0–15.0)
MCH: 29 pg (ref 26.0–34.0)
MCHC: 33.2 g/dL (ref 30.0–36.0)
MCV: 87.4 fL (ref 80.0–100.0)
Platelets: 262 K/uL (ref 150–400)
RBC: 4.28 MIL/uL (ref 3.87–5.11)
RDW: 13.1 % (ref 11.5–15.5)
WBC: 11.7 K/uL — ABNORMAL HIGH (ref 4.0–10.5)
nRBC: 0 % (ref 0.0–0.2)

## 2024-08-15 LAB — URINALYSIS, MICROSCOPIC (REFLEX)

## 2024-08-15 LAB — BASIC METABOLIC PANEL WITH GFR
Anion gap: 11 (ref 5–15)
BUN: 14 mg/dL (ref 6–20)
CO2: 26 mmol/L (ref 22–32)
Calcium: 9.2 mg/dL (ref 8.9–10.3)
Chloride: 103 mmol/L (ref 98–111)
Creatinine, Ser: 0.68 mg/dL (ref 0.44–1.00)
GFR, Estimated: >60 mL/min
Glucose, Bld: 91 mg/dL (ref 70–99)
Potassium: 4.2 mmol/L (ref 3.5–5.1)
Sodium: 139 mmol/L (ref 135–145)

## 2024-08-15 LAB — URINALYSIS, ROUTINE W REFLEX MICROSCOPIC
Bilirubin Urine: NEGATIVE
Glucose, UA: NEGATIVE mg/dL
Ketones, ur: NEGATIVE mg/dL
Nitrite: POSITIVE — AB
Protein, ur: NEGATIVE mg/dL
Specific Gravity, Urine: 1.025 (ref 1.005–1.030)
pH: 6.5 (ref 5.0–8.0)

## 2024-08-15 LAB — PREGNANCY, URINE: Preg Test, Ur: NEGATIVE

## 2024-08-15 MED ORDER — MORPHINE SULFATE (PF) 4 MG/ML IV SOLN
4.0000 mg | Freq: Once | INTRAVENOUS | Status: AC
  Administered 2024-08-15: 4 mg via INTRAVENOUS
  Filled 2024-08-15: qty 1

## 2024-08-15 MED ORDER — SODIUM CHLORIDE 0.9 % IV SOLN
1.0000 g | Freq: Once | INTRAVENOUS | Status: AC
  Administered 2024-08-15: 1 g via INTRAVENOUS

## 2024-08-15 MED ORDER — NITROFURANTOIN MONOHYD MACRO 100 MG PO CAPS: 100.0000 mg | ORAL_CAPSULE | Freq: Two times a day (BID) | ORAL | 0 refills | Status: AC

## 2024-08-15 MED ORDER — HYDROCODONE-ACETAMINOPHEN 5-325 MG PO TABS: 1.0000 | ORAL_TABLET | Freq: Four times a day (QID) | ORAL | 0 refills | Status: DC | PRN

## 2024-08-15 NOTE — ED Triage Notes (Signed)
 Pt c/o LT flank pain x 2d; reports urinary frequency, hx of kidney stones

## 2024-08-15 NOTE — ED Provider Notes (Signed)
 " [place] [facility] [place] Provider Note   CSN: [id number] Arrival date & time: 08/15/24  1753     Patient presents with: Flank Pain   Priscilla Barajas is a 38 y.o. female history of kidney infection here presenting with flank pain.  Patient has been having right flank pain for several days.  Patient has history of recurrent UTI.  Patient also history of kidney stone.   The history is provided by the patient.       Prior to Admission medications  Medication Sig Start Date End Date Taking? Authorizing Provider  acetaminophen  (TYLENOL ) 325 MG tablet Take 2 tablets (650 mg total) by mouth every 6 (six) hours as needed. 11/30/23   Elnor Jayson LABOR, DO  ALBUTEROL  IN Inhale into the lungs.    [provider]  celecoxib  (CELEBREX ) 200 MG capsule Take 1 capsule (200 mg total) by mouth 2 (two) times daily as needed. 05/11/24   Silver Wonda LABOR, PA  cyclobenzaprine  (FLEXERIL ) 10 MG tablet Take 1 tablet (10 mg total) by mouth 2 (two) times daily as needed for muscle spasms. 05/11/24   Silver Wonda LABOR, PA  fluconazole  (DIFLUCAN ) 150 MG tablet Take 1 tablet (150 mg total) by mouth daily. Following antibiotics 01/27/24   Palumbo, April, MD  fluticasone  (FLONASE ) 50 MCG/ACT nasal spray Place 1 spray into both nostrils daily. 04/20/18   Caccavale, Sophia, PA-C  lidocaine  (LIDODERM ) 5 % Place 1 patch onto the skin daily. Remove & Discard patch within 12 hours or as directed by MD 06/21/24   Nettie, April, MD  omeprazole  (PRILOSEC) 20 MG capsule Take 1 capsule (20 mg total) by mouth daily. 11/06/15   Palumbo, April, MD  ondansetron  (ZOFRAN ) 4 MG tablet Take 1 tablet (4 mg total) by mouth every 4 (four) hours as needed for nausea or vomiting. 11/30/23   Elnor Jayson A, DO  ondansetron  (ZOFRAN -ODT) 4 MG disintegrating tablet Take 1 tablet (4 mg total) by mouth every 8 (eight) hours as needed. 04/05/24   Curatolo, Adam, DO  oxybutynin  (DITROPAN  XL) 10 MG 24 hr tablet  Take 1 tablet (10 mg total) by mouth at bedtime. 01/27/24   Palumbo, April, MD  promethazine -dextromethorphan (PROMETHAZINE -DM) 6.25-15 MG/5ML syrup Take 5 mLs by mouth 4 (four) times daily as needed for cough. 04/20/18   Caccavale, Sophia, PA-C    Allergies: Ketorolac , Tramadol , Clindamycin, Penicillins, Sulfamethoxazole -trimethoprim , and Clindamycin/lincomycin    Review of Systems  Genitourinary:  Positive for flank pain.  All other systems reviewed and are negative.   Updated Vital Signs BP 114/65 (BP Location: Right Arm)   Pulse (!) 56   Temp 98.4 F (36.9 C) (Oral)   Resp 16   LMP 02/01/2014   SpO2 100%   Physical Exam Vitals and nursing note reviewed.  Constitutional:      Appearance: Normal appearance.  HENT:     Head: Normocephalic.     Nose: Nose normal.     Mouth/Throat:     Mouth: Mucous membranes are moist.  Eyes:     Extraocular Movements: Extraocular movements intact.     Pupils: Pupils are equal, round, and reactive to light.  Cardiovascular:     Rate and Rhythm: Normal rate and regular rhythm.     Pulses: Normal pulses.     Heart sounds: Normal heart sounds.  Pulmonary:     Effort: Pulmonary effort is normal.     Breath sounds: Normal breath sounds.  Abdominal:  General: Abdomen is flat.     Palpations: Abdomen is soft.  Musculoskeletal:        General: Normal range of motion.     Cervical back: Normal range of motion.  Skin:    General: Skin is warm.     Capillary Refill: Capillary refill takes less than 2 seconds.  Neurological:     General: No focal deficit present.     Mental Status: She is alert and oriented to person, place, and time.  Psychiatric:        Mood and Affect: Mood normal.        Behavior: Behavior normal.     (all labs ordered are listed, but only abnormal results are displayed) Labs Reviewed  URINALYSIS, ROUTINE W REFLEX MICROSCOPIC - Abnormal; Notable for the following components:      Result Value   APPearance HAZY (*)     Hgb urine dipstick TRACE (*)    Nitrite POSITIVE (*)    Leukocytes,Ua LARGE (*)    All other components within normal limits  CBC - Abnormal; Notable for the following components:   WBC 11.7 (*)    All other components within normal limits  URINALYSIS, MICROSCOPIC (REFLEX) - Abnormal; Notable for the following components:   Bacteria, UA MANY (*)    All other components within normal limits  URINE CULTURE  BASIC METABOLIC PANEL WITH GFR  PREGNANCY, URINE    EKG: None  Radiology: CT Renal Stone Study Result Date: 08/15/2024 EXAM: CT UROGRAM 08/15/2024 10:00:00 PM TECHNIQUE: CT of the abdomen and pelvis was performed without the administration of intravenous contrast. Multiplanar reformatted images as well as MIP urogram images are provided for review. Automated exposure control, iterative reconstruction, and/or weight based adjustment of the mA/kV was utilized to reduce the radiation dose to as low as reasonably achievable. COMPARISON: CT abdomen and pelvis 04/05/2024. CLINICAL HISTORY: Abdominal/flank pain, stone suspected. FINDINGS: LOWER CHEST: No acute abnormality. LIVER: The liver is unremarkable. GALLBLADDER AND BILE DUCTS: Gallbladder surgically absent. No biliary ductal dilatation. SPLEEN: No acute abnormality. PANCREAS: No acute abnormality. ADRENAL GLANDS: No acute abnormality. KIDNEYS, URETERS AND BLADDER: No stones in the kidneys or ureters. No hydronephrosis. No perinephric or periureteral stranding. Urinary bladder is unremarkable. GI AND BOWEL: Stomach demonstrates no acute abnormality. The appendix is not visualized. There is no bowel obstruction. PERITONEUM AND RETROPERITONEUM: No ascites. No free air. VASCULATURE: Aorta is normal in caliber. LYMPH NODES: No lymphadenopathy. REPRODUCTIVE ORGANS: Uterus is surgically absent. BONES AND SOFT TISSUES: Multilevel degenerative changes affect the spine. 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 No focal soft tissue  abnormality. IMPRESSION: 1. No nephrolithiasis, obstructive uropathy, or urothelial mass. 2. Gallbladder and uterus surgically absent; appendix not visualized. Electronically signed by: Greig Pique MD 08/15/2024 10:34 PM EST RP Workstation: [id number]     Procedures   Medications Ordered in the ED  meropenem  (MERREM ) 1 g in sodium chloride  0.9 % 100 mL IVPB (0 g Intravenous Stopped 08/15/24 2207)  morphine  (PF) 4 MG/ML injection 4 mg (4 mg Intravenous Given 08/15/24 2134)                                    Medical Decision Making Priscilla Barajas is a 38 y.o. female here presenting with right flank pain.  Patient has history of recurrent UTI.  Patient in fact had E. coli and Klebsiella was resistant to most antibiotics except Zosyn and  nitrofurantoin .  Patient has a penicillin allergy.  Will try meropenem  and we will check labs and UA and CT renal stone  11:05 PM CT renal stone showed no obvious stone.  Patient's UA showed UTI.  Patient was given meropenem .  Urine culture sent.  Previous urine culture has been sensitive to Macrobid .  Will discharge home with Macrobid  and tramadol    Problems Addressed: Acute cystitis without hematuria: acute illness or injury  Amount and/or Complexity of Data Reviewed Labs: ordered. Decision-making details documented in ED Course. Radiology: ordered and independent interpretation performed. Decision-making details documented in ED Course.  Risk Prescription drug management.     Final diagnoses:  None    ED Discharge Orders     None          Patt Alm Macho, MD 08/15/24 2306  "

## 2024-08-15 NOTE — Discharge Instructions (Addendum)
 Take Macrobid  twice today for a week for kidney infection  Take Norco as needed for pain  We sent off a urine culture and you will be called if you have a resistant organism  See your doctor for follow-up  Return to ER if you have severe flank pain or vomiting

## 2024-08-16 ENCOUNTER — Other Ambulatory Visit: Payer: Self-pay

## 2024-08-16 ENCOUNTER — Encounter (HOSPITAL_BASED_OUTPATIENT_CLINIC_OR_DEPARTMENT_OTHER): Payer: Self-pay

## 2024-08-16 ENCOUNTER — Emergency Department (HOSPITAL_BASED_OUTPATIENT_CLINIC_OR_DEPARTMENT_OTHER)
Admission: EM | Admit: 2024-08-16 | Discharge: 2024-08-16 | Disposition: A | Discharge status: Home / Self Care | Attending: Emergency Medicine | Admitting: Emergency Medicine

## 2024-08-16 DIAGNOSIS — Z79899 Other long term (current) drug therapy: Secondary | ICD-10-CM | POA: Diagnosis not present

## 2024-08-16 DIAGNOSIS — Z87440 Personal history of urinary (tract) infections: Secondary | ICD-10-CM | POA: Insufficient documentation

## 2024-08-16 DIAGNOSIS — D72829 Elevated white blood cell count, unspecified: Secondary | ICD-10-CM | POA: Insufficient documentation

## 2024-08-16 DIAGNOSIS — J45909 Unspecified asthma, uncomplicated: Secondary | ICD-10-CM | POA: Insufficient documentation

## 2024-08-16 DIAGNOSIS — R10A1 Flank pain, right side: Secondary | ICD-10-CM | POA: Insufficient documentation

## 2024-08-16 LAB — URINALYSIS, ROUTINE W REFLEX MICROSCOPIC
Bilirubin Urine: NEGATIVE
Glucose, UA: NEGATIVE mg/dL
Hgb urine dipstick: NEGATIVE
Ketones, ur: NEGATIVE mg/dL
Nitrite: NEGATIVE
Protein, ur: NEGATIVE mg/dL
Specific Gravity, Urine: 1.015 (ref 1.005–1.030)
pH: 6 (ref 5.0–8.0)

## 2024-08-16 LAB — PREGNANCY, URINE: Preg Test, Ur: NEGATIVE

## 2024-08-16 LAB — CBC WITH DIFFERENTIAL/PLATELET
Abs Immature Granulocytes: 0.03 K/uL (ref 0.00–0.07)
Basophils Absolute: 0 K/uL (ref 0.0–0.1)
Basophils Relative: 0 %
Eosinophils Absolute: 0.3 K/uL (ref 0.0–0.5)
Eosinophils Relative: 3 %
HCT: 38 % (ref 36.0–46.0)
Hemoglobin: 12.6 g/dL (ref 12.0–15.0)
Immature Granulocytes: 0 %
Lymphocytes Relative: 36 %
Lymphs Abs: 4.2 K/uL — ABNORMAL HIGH (ref 0.7–4.0)
MCH: 29 pg (ref 26.0–34.0)
MCHC: 33.2 g/dL (ref 30.0–36.0)
MCV: 87.6 fL (ref 80.0–100.0)
Monocytes Absolute: 0.7 K/uL (ref 0.1–1.0)
Monocytes Relative: 6 %
Neutro Abs: 6.3 K/uL (ref 1.7–7.7)
Neutrophils Relative %: 55 %
Platelets: 255 K/uL (ref 150–400)
RBC: 4.34 MIL/uL (ref 3.87–5.11)
RDW: 13.1 % (ref 11.5–15.5)
WBC: 11.6 K/uL — ABNORMAL HIGH (ref 4.0–10.5)
nRBC: 0 % (ref 0.0–0.2)

## 2024-08-16 LAB — COMPREHENSIVE METABOLIC PANEL WITH GFR
ALT: 16 U/L (ref 0–44)
AST: 18 U/L (ref 15–41)
Albumin: 4.2 g/dL (ref 3.5–5.0)
Alkaline Phosphatase: 105 U/L (ref 38–126)
Anion gap: 12 (ref 5–15)
BUN: 11 mg/dL (ref 6–20)
CO2: 26 mmol/L (ref 22–32)
Calcium: 9.3 mg/dL (ref 8.9–10.3)
Chloride: 102 mmol/L (ref 98–111)
Creatinine, Ser: 0.64 mg/dL (ref 0.44–1.00)
GFR, Estimated: >60 mL/min
Glucose, Bld: 98 mg/dL (ref 70–99)
Potassium: 3.8 mmol/L (ref 3.5–5.1)
Sodium: 141 mmol/L (ref 135–145)
Total Bilirubin: <0.2 mg/dL (ref 0.0–1.2)
Total Protein: 6.7 g/dL (ref 6.5–8.1)

## 2024-08-16 LAB — URINALYSIS, MICROSCOPIC (REFLEX)

## 2024-08-16 MED ORDER — LACTATED RINGERS IV BOLUS
1000.0000 mL | Freq: Once | INTRAVENOUS | Status: AC
  Administered 2024-08-16: 1000 mL via INTRAVENOUS

## 2024-08-16 MED ORDER — ONDANSETRON 4 MG PO TBDP: 4.0000 mg | ORAL_TABLET | Freq: Three times a day (TID) | ORAL | 0 refills | Status: AC | PRN

## 2024-08-16 MED ORDER — CEPHALEXIN 500 MG PO CAPS: 500.0000 mg | ORAL_CAPSULE | Freq: Three times a day (TID) | ORAL | 0 refills | Status: AC

## 2024-08-16 MED ORDER — ONDANSETRON HCL 4 MG/2ML IJ SOLN
4.0000 mg | Freq: Once | INTRAMUSCULAR | Status: AC
  Administered 2024-08-16: 4 mg via INTRAVENOUS
  Filled 2024-08-16: qty 2

## 2024-08-16 MED ORDER — MORPHINE SULFATE (PF) 4 MG/ML IV SOLN
4.0000 mg | Freq: Once | INTRAVENOUS | Status: AC
  Administered 2024-08-16: 4 mg via INTRAVENOUS
  Filled 2024-08-16: qty 1

## 2024-08-16 MED ORDER — SODIUM CHLORIDE 0.9 % IV SOLN
1.0000 g | Freq: Three times a day (TID) | INTRAVENOUS | Status: DC
  Administered 2024-08-16: 1 g via INTRAVENOUS

## 2024-08-16 NOTE — ED Triage Notes (Signed)
 Seen in ED last night for UTI.  Reports worsening R sided abd pain and flank pain. Emesis, chills, sweating

## 2024-08-16 NOTE — ED Provider Notes (Signed)
 " [place] [facility] [place] Provider Note   CSN: [id number] Arrival date & time: 08/16/24  1900     Patient presents with: Abdominal Pain   Priscilla Barajas is a 38 y.o. female.  {Add pertinent medical, surgical, social history, OB history to HPI:32947} HPI      Hx of UTIs recurrent, last time needed IV antibiotics Went to get the medicine this AM   Was given meropenem  last night, rx for macrobid  due to prior sensitivities of urine cultures   3 days ago right sided flank pain, RLQ and to back Vomited at least 4 times today, continued nausea No known fevers but has felt hot and cold, chills   Prior to Admission medications  Medication Sig Start Date End Date Taking? Authorizing Provider  acetaminophen  (TYLENOL ) 325 MG tablet Take 2 tablets (650 mg total) by mouth every 6 (six) hours as needed. 11/30/23   Elnor Jayson LABOR, DO  ALBUTEROL  IN Inhale into the lungs.    [provider]  celecoxib  (CELEBREX ) 200 MG capsule Take 1 capsule (200 mg total) by mouth 2 (two) times daily as needed. 05/11/24   Silver Wonda LABOR, PA  cyclobenzaprine  (FLEXERIL ) 10 MG tablet Take 1 tablet (10 mg total) by mouth 2 (two) times daily as needed for muscle spasms. 05/11/24   Silver Wonda LABOR, PA  fluconazole  (DIFLUCAN ) 150 MG tablet Take 1 tablet (150 mg total) by mouth daily. Following antibiotics 01/27/24   Palumbo, April, MD  fluticasone  (FLONASE ) 50 MCG/ACT nasal spray Place 1 spray into both nostrils daily. 04/20/18   Caccavale, Sophia, PA-C  HYDROcodone -acetaminophen  (NORCO/VICODIN) 5-325 MG tablet Take 1 tablet by mouth every 6 (six) hours as needed. 08/15/24   Patt Alm Macho, MD  lidocaine  (LIDODERM ) 5 % Place 1 patch onto the skin daily. Remove & Discard patch within 12 hours or as directed by MD 06/21/24   Nettie, April, MD  nitrofurantoin , macrocrystal-monohydrate, (MACROBID ) 100 MG capsule Take 1 capsule (100 mg total) by mouth 2 (two) times daily.  08/15/24   Patt Alm Macho, MD  omeprazole  (PRILOSEC) 20 MG capsule Take 1 capsule (20 mg total) by mouth daily. 11/06/15   Palumbo, April, MD  ondansetron  (ZOFRAN ) 4 MG tablet Take 1 tablet (4 mg total) by mouth every 4 (four) hours as needed for nausea or vomiting. 11/30/23   Elnor Jayson A, DO  ondansetron  (ZOFRAN -ODT) 4 MG disintegrating tablet Take 1 tablet (4 mg total) by mouth every 8 (eight) hours as needed. 04/05/24   Curatolo, Adam, DO  oxybutynin  (DITROPAN  XL) 10 MG 24 hr tablet Take 1 tablet (10 mg total) by mouth at bedtime. 01/27/24   Palumbo, April, MD  promethazine -dextromethorphan (PROMETHAZINE -DM) 6.25-15 MG/5ML syrup Take 5 mLs by mouth 4 (four) times daily as needed for cough. 04/20/18   Caccavale, Sophia, PA-C    Allergies: Ketorolac , Tramadol , Clindamycin, Penicillins, Sulfamethoxazole -trimethoprim , and Clindamycin/lincomycin    Review of Systems  Updated Vital Signs BP (!) 136/99 (BP Location: Right Arm)   Pulse 65   Temp 98.2 F (36.8 C)   Resp 20   LMP 02/01/2014   SpO2 99%   Physical Exam  (all labs ordered are listed, but only abnormal results are displayed) Labs Reviewed  CBC WITH DIFFERENTIAL/PLATELET - Abnormal; Notable for the following components:      Result Value   WBC 11.6 (*)    Lymphs Abs 4.2 (*)    All other components within normal limits  URINALYSIS, ROUTINE W  REFLEX MICROSCOPIC - Abnormal; Notable for the following components:   Leukocytes,Ua SMALL (*)    All other components within normal limits  URINALYSIS, MICROSCOPIC (REFLEX) - Abnormal; Notable for the following components:   Bacteria, UA RARE (*)    All other components within normal limits  COMPREHENSIVE METABOLIC PANEL WITH GFR  PREGNANCY, URINE    EKG: None  Radiology: CT Renal Stone Study Result Date: 08/15/2024 EXAM: CT UROGRAM 08/15/2024 10:00:00 PM TECHNIQUE: CT of the abdomen and pelvis was performed without the administration of intravenous contrast. Multiplanar  reformatted images as well as MIP urogram images are provided for review. Automated exposure control, iterative reconstruction, and/or weight based adjustment of the mA/kV was utilized to reduce the radiation dose to as low as reasonably achievable. COMPARISON: CT abdomen and pelvis 04/05/2024. CLINICAL HISTORY: Abdominal/flank pain, stone suspected. FINDINGS: LOWER CHEST: No acute abnormality. LIVER: The liver is unremarkable. GALLBLADDER AND BILE DUCTS: Gallbladder surgically absent. No biliary ductal dilatation. SPLEEN: No acute abnormality. PANCREAS: No acute abnormality. ADRENAL GLANDS: No acute abnormality. KIDNEYS, URETERS AND BLADDER: No stones in the kidneys or ureters. No hydronephrosis. No perinephric or periureteral stranding. Urinary bladder is unremarkable. GI AND BOWEL: Stomach demonstrates no acute abnormality. The appendix is not visualized. There is no bowel obstruction. PERITONEUM AND RETROPERITONEUM: No ascites. No free air. VASCULATURE: Aorta is normal in caliber. LYMPH NODES: No lymphadenopathy. REPRODUCTIVE ORGANS: Uterus is surgically absent. BONES AND SOFT TISSUES: Multilevel degenerative changes affect the spine. 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 No focal soft tissue abnormality. IMPRESSION: 1. No nephrolithiasis, obstructive uropathy, or urothelial mass. 2. Gallbladder and uterus surgically absent; appendix not visualized. Electronically signed by: Greig Pique MD 08/15/2024 10:34 PM EST RP Workstation: [id number]    {Document cardiac monitor, telemetry assessment procedure when appropriate:32947} Procedures   Medications Ordered in the ED - No data to display    {Click here for ABCD2, HEART and other calculators REFRESH Note before signing:1}                              Medical Decision Making Amount and/or Complexity of Data Reviewed Labs: ordered.   ***  {Document critical care time when appropriate  Document review of labs and clinical decision  tools ie CHADS2VASC2, etc  Document your independent review of radiology images and any outside records  Document your discussion with family members, caretakers and with consultants  Document social determinants of health affecting pt's care  Document your decision making why or why not admission, treatments were needed:32947:::1}   Final diagnoses:  None    ED Discharge Orders     None        "

## 2024-08-17 LAB — URINE CULTURE

## 2024-08-18 LAB — URINE CULTURE: Culture: NO GROWTH

## 2024-08-21 NOTE — ED Provider Notes (Signed)
 " [facility]  ED Provider Note  Mithra Spano 39 y.o. female DOB: 26-Jan-1986 MRN: [id number] History   Chief Complaint  Patient presents with   Flank Pain    Pt reports worsening right sided flank pain with dysuria, dx with UTI recently - pt tearful in triage - has tried hydrocodone  with out relief    Patient is a 39 year old female with history of chronic urinary issues, bipolar disorder, who presents to the emergency department today for evaluation of back pain.  Patient reports lumbar back pain in the midline this been going on for about 2 days, constant, rated 10 out of 10, no relief with methocarbamol and over-the-counter pain medication.  No injury.  No current significant dysuria.  No hematuria.  No nausea or vomiting.  No fever.  No incontinence or saddle anesthesia.  She has had 10 CT scans done within the past calendar year that were all generally unremarkable but she has had an L4 laminectomy and bilateral pars defects that are commented on on the scans.  She is being followed by urology for chronic UTIs and is currently on Macrobid  suppression for 90 days.       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   Tobacco Use History[1] E-Cigarettes   Vaping Use Current Every Day User    Start Date     Cartridges/Day     Quit Date     Social History   Substance and Sexual  Activity  Drug Use Never         Allergies[2]  Home Medications   ALBUTEROL  SULFATE HFA (PROVENTIL ,VENTOLIN ,PROAIR ) 108 (90 BASE) MCG/ACT INHALER    Inhale one puff to two puffs into the lungs every 6 (six) hours as needed for Wheezing.   FLUTICASONE -SALMETEROL (ADVAIR HFA) 45-21 MCG/ACTUATION INHALER    Inhale two puffs into the lungs 2 (two) times daily.   IBUPROFEN  (ADVIL ,MOTRIN ) 800 MG TABLET    Take one tablet (800 mg dose) by mouth 3 (three) times a day.   LIDOCAINE  4% PATCH 4 %    Place one patch onto the skin daily. Remove & Discard patch within 12 hours or as directed by MD   NALOXONE (NARCAN) NASAL SPRAY (TAKE HOME PACK)    one spray by Intranasal route as needed for Opioid Reversal for up to 1 dose.   NITROFURANTOIN  MACROCRYSTAL (MACRODANTIN ) 100 MG CAPSULE    Take one capsule (100 mg dose) by mouth at bedtime.   SENNA CO    TAKE 1 TABLET BY MOUTH TWICE A DAY   TIZANIDINE (ZANAFLEX) 4 MG TABLET    Take one tablet (4 mg dose) by mouth every 8 (eight) hours as needed.    Primary Survey  Primary  Survey  Review of Systems   Review of Systems  All other systems reviewed and are negative.   Physical Exam   ED Triage Vitals [08/20/24 2127]  BP 127/74  Heart Rate 59  Resp 22  SpO2 99 %  Temp     Physical Exam  Constitutional: She appears well-developed and well-nourished. She does not appear distressed and no respiratory distress.  HENT:  Head: Normocephalic and atraumatic.  Right Ear: Normal external ear.  Left Ear: Normal external ear.  Nose: Nose normal.  Mouth/Throat: No oropharyngeal exudate. Eyes: EOM are intact. Conjunctivae are normal. Pupils are equal, round, and reactive to light. Right eye: no drainage. Left eye: no drainage. No scleral icterus.  Neck: No JVD. No tracheal deviation.  Cardiovascular: Normal rate, regular rhythm and intact distal pulses.  No audible murmur. No friction rub and gallop.  Pulmonary/Chest: No respiratory distress. Respiratory  effort normal and breath sounds normal.  Abdominal: Soft. There is no abdominal tenderness. Abdomen not distended. Bowel sounds are normal.  No CVA tenderness  Musculoskeletal: No obvious deformity noted to extremities.     Cervical back: No rigidity.     Comments: No midline spinal tenderness or spasm no edema.  Neurological: She is alert and oriented to person, place, and time. Cranial nerves intact II through XII. She has normal strength. Sensory intact.  Skin: Skin is warm. Skin is dry. No erythema noted.  Psychiatric: She has a normal mood and affect.     ED Course   Lab results:   CBC AND DIFFERENTIAL - Abnormal      Result Value   WBC 11.6 (*)    RBC 3.96     HGB 11.4     HCT 34.8     MCV 87.9     MCH 28.8     MCHC 32.8     Plt Ct 258     RDW SD 41.4     MPV 11.6     NRBC% 0.0     Absolute NRBC Count 0.00     NEUTROPHIL % 62.5     LYMPHOCYTE % 29.0     MONOCYTE % 5.3     Eosinophil % 2.6     BASOPHIL % 0.3     IG% 0.3     ABSOLUTE NEUTROPHIL COUNT 7.27     ABSOLUTE LYMPHOCYTE COUNT 3.37     Absolute Monocyte Count 0.61     Absolute Eosinophil Count 0.30     Absolute Basophil Count 0.03     Absolute Immature Granulocyte Count 0.03    URINALYSIS W/MICRO REFLEX CULTURE - SYMPTOMATIC - Abnormal   Urine Color Yellow     Urine Clarity Clear     Urine Specific Gravity 1.021     Urine pH 6.5     Urine Protein - Dipstick Negative     Urine Glucose Negative     Urine Ketones Negative     Urine Bilirubin Negative     Urine Blood Negative     Urine Nitrite Negative     Urine Urobilinogen <2     Urine Leukocyte Esterase 500 (*)    Urine Squamous Epithelial Cells 3-4 (*)    Urine WBC 10-20 (*)    Urine RBC 3-4 (*)    Urine Bacteria None     UA Microscopic Yes Micro (*)   COMPREHENSIVE METABOLIC PANEL - Normal   Na 139     Potassium 3.9     Cl 102  CO2 27     AGAP 10     Glucose 98     BUN 13     Creatinine 0.65     Ca 9.5     ALK PHOS 105     T Bili  <0.2     Total Protein 6.5     Alb 4.1     GLOBULIN 2.4     ALBUMIN/GLOBULIN RATIO 1.7     BUN/CREAT RATIO 20.0     ALT 16     AST 17     eGFR 115     Comment: Normal GFR (glomerular filtration rate) > 60 mL/min/1.73 meters squared, < 60 may include impaired kidney function. Calculation based on the Chronic Kidney Disease Epidemiology Collaboration (CK-EPI)equation refit without adjustment for race.  CULTURE, URINE  LIGHT BLUE TOP  GOLD SST    Imaging: No data to display   ECG: ECG Results   None                                                                        Pre-Sedation Procedures    Medical Decision Making 39 year old female who presents to the emergency department today for evaluation of back pain.  Her medical screening exam is generally unremarkable at this time other than she seems to be in some pain.  I do not appreciate any midline spinal tenderness, no spasm, negative straight leg raise.  She denies any current urinary tract symptoms and is currently on antibiotic prophylaxis with Macrobid .  Differential diagnosis: Suspect this is pain secondary to her chronic pars defects, there is no red flag signs of epidural abscess, cord compression or cauda equina syndrome.  I do not see any indication for imaging at this time.  Labs and UA were ordered by triage and are generally unremarkable other than some leukocytes in her urine.  Given lack of significant urinary symptoms we will just have the culture be followed.  We will give her a dose of Decadron, oxycodone , IM Toradol  and reevaluate.  Patient states she still having significant pain, however, she appears to be resting very comfortably.  Stable for discharge outpatient follow-up as she is requesting to be discharged.  Amount and/or Complexity of Data Reviewed Labs: ordered.  Risk Prescription drug management.       In reviewing the patient's old records, I reviewed  their prior controlled substances prescriptions, using the PDMP.  Provider Communication  New Prescriptions   METHYLPREDNISOLONE (MEDROL DOSEPACK) 4 MG TABLET    follow package directions.      Quantity: 21 tablet    Refills: 0   NAPROXEN (NAPROSYN) 500 MG TABLET    Take one tablet (500 mg dose) by mouth 2 (two) times daily.      Quantity: 30 tablet    Refills: 0   OXYCODONE  HCL (ROXICODONE ) 5 MG IMMEDIATE RELEASE TABLET    Take one tablet (5 mg dose) by mouth every 6 (six) hours as needed for Pain for up to 3 days. Max Daily Amount: 20 mg      Quantity: 12 tablet    Refills: 0    Modified Medications   No medications on file    Discontinued Medications   NAPROXEN (NAPROSYN) 500  MG TABLET    Take one tablet (500 mg dose) by mouth 2 (two) times a day as needed (pain) for up to 10 days.    Clinical Impression Final diagnoses:  Acute midline low back pain without sciatica    ED Disposition     ED Disposition  Discharge   Condition  Stable   Comment  --                 Follow-up Information     Warren LITTIE Chambers, FNP. Schedule an appointment as soon as possible for a visit in 3 days.   Specialties: Bariatric Surgery , Nurse Practitioner, General Surgery Comments: For follow up Contact information: [address] [place] [address] [phone]                  Electronically signed by:       [1] Social History Tobacco Use  Smoking Status Every Day   Current packs/day: 0.50   Average packs/day: 0.5 packs/day for 2.0 years (1.0 ttl pk-yrs)   Types: Cigarettes   Passive exposure: Never  Smokeless Tobacco Never  [2] Allergies Allergen Reactions   Clindamycin Hives    Felt like throat closing   Penicillins Rash and Dermatitis    Pt reports this only gives her a yeast infection. Pt has recently taken a dose with no reactions noted.  hives    Pt reports this only gives her a yeast infection. Pt has  recently taken a dose with no reactions noted.  hives    Pt reports this only gives her a yeast infection. Pt has recently taken a dose with no reactions noted.    hives  Pt reports this only gives her a yeast infection. Pt has recently taken a dose with no reactions noted.   Sulfamethoxazole -Trimethoprim  Rash and Dermatitis    Patient states that she is tolerated this several times and can take this medicine   Norleen JONETTA Hopes, MD 08/21/24 0136  "

## 2024-08-23 NOTE — ED Provider Notes (Signed)
 " [facility]  ED Provider Note  Ceaira Ernster 39 y.o. female DOB: [birth year] MRN: [id number] History   Chief Complaint  Patient presents with   Headache (New onset or new symptoms)    Pt ambulates to triage c/o of waking up with a severe HA and palpated a knot to the back of her head. Pt does of a hx of migraines but says this feels different    39 year old female with prior medical history as detailed below presents for evaluation.  Patient complains of waking up with a headache this morning.  Headache has been persistent through the entire day.  She also reports that she feels a lump on the back of her head.  She denies any trauma.  She denies fever.  She denies visual change.  She denies focal weakness.  She is ambulatory.  She is in no apparent distress during exam.   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   Tobacco Use History[1] E-Cigarettes   Vaping Use Current Every Day User    Start Date     Cartridges/Day     Quit Date     Social History   Substance and Sexual Activity  Drug Use Never         Allergies[2]  Home Medications   ALBUTEROL  SULFATE HFA (PROVENTIL ,VENTOLIN ,PROAIR ) 108 (90 BASE) MCG/ACT INHALER    Inhale one puff to two puffs into the lungs every 6 (six) hours as needed for Wheezing.    FLUTICASONE -SALMETEROL (ADVAIR HFA) 45-21 MCG/ACTUATION INHALER    Inhale two puffs into the lungs 2 (two) times daily.   IBUPROFEN  (ADVIL ,MOTRIN ) 800 MG TABLET    Take one tablet (800 mg dose) by mouth 3 (three) times a day.   LIDOCAINE  4% PATCH 4 %    Place one patch onto the skin daily. Remove & Discard patch within 12 hours or as directed by MD   METHYLPREDNISOLONE (MEDROL DOSEPACK) 4 MG TABLET    follow package directions.   NALOXONE (NARCAN) NASAL SPRAY (TAKE HOME PACK)    one spray by Intranasal route as needed for Opioid Reversal for up to 1 dose.   NAPROXEN (NAPROSYN) 500 MG TABLET    Take one tablet (500 mg dose) by mouth 2 (two) times daily.   NITROFURANTOIN  MACROCRYSTAL (MACRODANTIN ) 100 MG CAPSULE    Take one capsule (100 mg dose) by mouth at bedtime.   OXYCODONE  HCL (ROXICODONE ) 5 MG IMMEDIATE RELEASE TABLET    Take one tablet (5 mg dose) by mouth every 6 (six) hours as needed for Pain for up to 3 days. Max Daily Amount: 20 mg   SENNA CO  TAKE 1 TABLET BY MOUTH TWICE A DAY   TIZANIDINE (ZANAFLEX) 4 MG TABLET    Take one tablet (4 mg dose) by mouth every 8 (eight) hours as needed.    Primary Survey   Exposure     No visible trauma noted on back exam.      Review of Systems   Review of Systems  All other systems reviewed and are negative.   Physical Exam   ED Triage Vitals [08/23/24 2141]  BP (!) 152/74  Heart Rate 56  Resp 16  SpO2 99 %  Temp 98.3 F (36.8 C)    Physical Exam  Nursing note and vitals reviewed. Constitutional: She appears well-developed and well-nourished.  HENT:  Head: Normocephalic and atraumatic.  Eyes: EOM are intact. Pupils are equal, round, and reactive to light.  Neck: Normal range of motion. Neck supple.  Cardiovascular: Normal rate and regular rhythm.  Pulmonary/Chest: Respiratory effort normal and breath sounds normal.  Abdominal: Soft. Bowel sounds are normal.  Musculoskeletal: Normal range of motion. No visible trauma noted  on back exam.     Cervical back: Normal range of motion and neck supple.   Neurological: She is alert and oriented to person, place, and time. Moves all extremities equally. She has normal speech. Cranial nerves intact II through XII. Sensation intact to light touch, bilateral upper and lower extremities.  Skin: Skin is warm.     ED Course   Lab results: No data to display  Imaging: No data to display   ECG: ECG Results   None                        Level of Consciousness (1a.): Alert, keenly responsive                                               Pre-Sedation Procedures    Medical Decision Making The patient is presenting with complaint of poorly described headache.  Patient is nontoxic in appearance.  She is neurologically intact.  The lump that she has palpated is most likely a small lymph node.  There is no evidence of cellulitis, infection, trauma to her scalp.  With treatment she feels improved.    She is appropriate for discharge home.  Importance of close follow-up stressed.  Strict return precautions given and understood.      Risk OTC drugs. Prescription drug management.          Provider Communication  New Prescriptions   No medications on file    Modified Medications   No medications on file    Discontinued Medications   No medications on file    Clinical Impression Final diagnoses:  Acute nonintractable headache, unspecified headache type    ED Disposition     ED Disposition  Discharge   Condition  Stable   Comment  --                   Electronically signed by:       [1] Social History Tobacco Use  Smoking Status Every Day   Current packs/day: 0.50   Average packs/day: 0.5 packs/day for 2.0 years (1.0 ttl pk-yrs)   Types: Cigarettes   Passive exposure: Never  Smokeless Tobacco Never  [2] Allergies Allergen Reactions   Clindamycin Hives    Felt like  throat closing   Penicillins Rash and Dermatitis    Pt reports this only gives her a yeast infection. Pt has recently taken a dose with no reactions noted.  hives    Pt reports this only gives her a yeast infection. Pt has recently taken a dose with no reactions noted.  hives    Pt reports this only gives her a yeast infection. Pt has recently taken a dose with no reactions noted.    hives  Pt reports this only gives her a yeast infection. Pt has recently taken a dose with no reactions noted.   Sulfamethoxazole -Trimethoprim  Rash and Dermatitis    Patient states that she is tolerated this several times and can take this medicine   Maude Johnetta Galloway, MD 08/23/24 2335  "

## 2024-08-27 NOTE — Progress Notes (Signed)
 Attempted to contact the patient today regarding a recent emergency room visit.  Unable to reach the patient to schedule the recommended follow-up appointments.    MyChart message sent. no  Left message on voice mail including callback number.

## 2024-08-28 ENCOUNTER — Emergency Department (HOSPITAL_BASED_OUTPATIENT_CLINIC_OR_DEPARTMENT_OTHER)

## 2024-08-28 ENCOUNTER — Encounter (HOSPITAL_BASED_OUTPATIENT_CLINIC_OR_DEPARTMENT_OTHER): Payer: Self-pay

## 2024-08-28 ENCOUNTER — Other Ambulatory Visit: Payer: Self-pay

## 2024-08-28 ENCOUNTER — Inpatient Hospital Stay (HOSPITAL_BASED_OUTPATIENT_CLINIC_OR_DEPARTMENT_OTHER)
Admission: EM | Admit: 2024-08-28 | Discharge: 2024-08-30 | DRG: 690 | Discharge status: Against Medical Advice | Attending: Internal Medicine | Admitting: Internal Medicine

## 2024-08-28 DIAGNOSIS — Z882 Allergy status to sulfonamides status: Secondary | ICD-10-CM | POA: Diagnosis not present

## 2024-08-28 DIAGNOSIS — K219 Gastro-esophageal reflux disease without esophagitis: Secondary | ICD-10-CM | POA: Diagnosis present

## 2024-08-28 DIAGNOSIS — F419 Anxiety disorder, unspecified: Secondary | ICD-10-CM | POA: Diagnosis present

## 2024-08-28 DIAGNOSIS — F17210 Nicotine dependence, cigarettes, uncomplicated: Secondary | ICD-10-CM | POA: Diagnosis present

## 2024-08-28 DIAGNOSIS — J45909 Unspecified asthma, uncomplicated: Secondary | ICD-10-CM | POA: Diagnosis present

## 2024-08-28 DIAGNOSIS — N12 Tubulo-interstitial nephritis, not specified as acute or chronic: Secondary | ICD-10-CM | POA: Diagnosis present

## 2024-08-28 DIAGNOSIS — Z87440 Personal history of urinary (tract) infections: Secondary | ICD-10-CM

## 2024-08-28 DIAGNOSIS — N3000 Acute cystitis without hematuria: Secondary | ICD-10-CM | POA: Diagnosis present

## 2024-08-28 DIAGNOSIS — Z881 Allergy status to other antibiotic agents status: Secondary | ICD-10-CM

## 2024-08-28 DIAGNOSIS — K760 Fatty (change of) liver, not elsewhere classified: Secondary | ICD-10-CM | POA: Diagnosis present

## 2024-08-28 DIAGNOSIS — F32A Depression, unspecified: Secondary | ICD-10-CM | POA: Diagnosis present

## 2024-08-28 DIAGNOSIS — Z880 Allergy status to penicillin: Secondary | ICD-10-CM | POA: Diagnosis not present

## 2024-08-28 DIAGNOSIS — Z90710 Acquired absence of both cervix and uterus: Secondary | ICD-10-CM

## 2024-08-28 DIAGNOSIS — N390 Urinary tract infection, site not specified: Secondary | ICD-10-CM | POA: Diagnosis present

## 2024-08-28 DIAGNOSIS — B9620 Unspecified Escherichia coli [E. coli] as the cause of diseases classified elsewhere: Secondary | ICD-10-CM | POA: Diagnosis present

## 2024-08-28 LAB — COMPREHENSIVE METABOLIC PANEL WITH GFR
ALT: 26 U/L (ref 0–44)
AST: 23 U/L (ref 15–41)
Albumin: 4.5 g/dL (ref 3.5–5.0)
Alkaline Phosphatase: 108 U/L (ref 38–126)
Anion gap: 12 (ref 5–15)
BUN: 13 mg/dL (ref 6–20)
CO2: 26 mmol/L (ref 22–32)
Calcium: 9.3 mg/dL (ref 8.9–10.3)
Chloride: 102 mmol/L (ref 98–111)
Creatinine, Ser: 0.57 mg/dL (ref 0.44–1.00)
GFR, Estimated: >60 mL/min
Glucose, Bld: 98 mg/dL (ref 70–99)
Potassium: 4.2 mmol/L (ref 3.5–5.1)
Sodium: 139 mmol/L (ref 135–145)
Total Bilirubin: <0.2 mg/dL (ref 0.0–1.2)
Total Protein: 7 g/dL (ref 6.5–8.1)

## 2024-08-28 LAB — URINALYSIS, ROUTINE W REFLEX MICROSCOPIC
Bilirubin Urine: NEGATIVE
Glucose, UA: NEGATIVE mg/dL
Ketones, ur: NEGATIVE mg/dL
Nitrite: POSITIVE — AB
Protein, ur: NEGATIVE mg/dL
Specific Gravity, Urine: 1.015 (ref 1.005–1.030)
pH: 6 (ref 5.0–8.0)

## 2024-08-28 LAB — CBC
HCT: 37.3 % (ref 36.0–46.0)
Hemoglobin: 12.3 g/dL (ref 12.0–15.0)
MCH: 28.8 pg (ref 26.0–34.0)
MCHC: 33 g/dL (ref 30.0–36.0)
MCV: 87.4 fL (ref 80.0–100.0)
Platelets: 273 K/uL (ref 150–400)
RBC: 4.27 MIL/uL (ref 3.87–5.11)
RDW: 13.2 % (ref 11.5–15.5)
WBC: 12.9 K/uL — ABNORMAL HIGH (ref 4.0–10.5)
nRBC: 0 % (ref 0.0–0.2)

## 2024-08-28 LAB — URINALYSIS, MICROSCOPIC (REFLEX)

## 2024-08-28 LAB — LIPASE, BLOOD: Lipase: 29 U/L (ref 11–51)

## 2024-08-28 MED ORDER — IOHEXOL 300 MG/ML  SOLN
80.0000 mL | Freq: Once | INTRAMUSCULAR | Status: AC | PRN
  Administered 2024-08-28: 80 mL via INTRAVENOUS

## 2024-08-28 MED ORDER — SODIUM CHLORIDE 0.9 % IV SOLN
1.0000 g | Freq: Once | INTRAVENOUS | Status: AC
  Administered 2024-08-28: 1 g via INTRAVENOUS

## 2024-08-28 MED ORDER — MORPHINE SULFATE (PF) 4 MG/ML IV SOLN
4.0000 mg | Freq: Once | INTRAVENOUS | Status: AC
  Administered 2024-08-28: 4 mg via INTRAVENOUS
  Filled 2024-08-28: qty 1

## 2024-08-28 MED ORDER — MORPHINE SULFATE (PF) 2 MG/ML IV SOLN
2.0000 mg | Freq: Once | INTRAVENOUS | Status: AC
  Administered 2024-08-28: 2 mg via INTRAVENOUS
  Filled 2024-08-28: qty 1

## 2024-08-28 MED ORDER — KETOROLAC TROMETHAMINE 15 MG/ML IJ SOLN
15.0000 mg | Freq: Once | INTRAMUSCULAR | Status: AC
  Administered 2024-08-28: 15 mg via INTRAVENOUS
  Filled 2024-08-28: qty 1

## 2024-08-28 MED ORDER — ONDANSETRON HCL 4 MG/2ML IJ SOLN
4.0000 mg | Freq: Once | INTRAMUSCULAR | Status: AC
  Administered 2024-08-28: 4 mg via INTRAVENOUS
  Filled 2024-08-28: qty 2

## 2024-08-28 NOTE — ED Triage Notes (Signed)
 Pt reports she was here last week for UTI and RLQ abd pain has worsened. + nausea, but no fevers/chills. + dysuria and urinary frequency, denies hematuria.

## 2024-08-28 NOTE — ED Provider Notes (Signed)
 " [place] [facility] Provider Note   CSN: [id number] Arrival date & time: 08/28/24  8094     Patient presents with: Abdominal Pain   Priscilla Barajas is a 39 y.o. female.  Patient with past history significant for asthma, frequent kidney stones presents to the emergency department with concerns of abdominal pain.  Reports she was seen about 1 week ago for urinary symptoms and right lower quadrant pain.  States that this pain has worsened and has now developed some nausea but no reported vomiting.  She denies any fever or chills.  Does have dysuria, polyuria, but denies any gross hematuria.  She has been seen at other emergency departments in the last several days for a variety of symptoms including back pain, headaches.  Appears that she is concerned due to the recurrence of her urinary symptoms.  She states that she finished antibiotics that she had started in late December and had improvement in symptoms but unclear if this is a new infection or relapse of prior infection that was not fully cleared.   Abdominal Pain Associated symptoms: dysuria        Prior to Admission medications  Medication Sig Start Date End Date Taking? Authorizing Provider  acetaminophen  (TYLENOL ) 325 MG tablet Take 2 tablets (650 mg total) by mouth every 6 (six) hours as needed. 11/30/23   Elnor Jayson LABOR, DO  ALBUTEROL  IN Inhale into the lungs.    [provider]  celecoxib  (CELEBREX ) 200 MG capsule Take 1 capsule (200 mg total) by mouth 2 (two) times daily as needed. 05/11/24   Silver Wonda LABOR, PA  cyclobenzaprine  (FLEXERIL ) 10 MG tablet Take 1 tablet (10 mg total) by mouth 2 (two) times daily as needed for muscle spasms. 05/11/24   Silver Wonda LABOR, PA  fluconazole  (DIFLUCAN ) 150 MG tablet Take 1 tablet (150 mg total) by mouth daily. Following antibiotics 01/27/24   Palumbo, April, MD  fluticasone  (FLONASE ) 50 MCG/ACT nasal spray Place 1 spray into both nostrils  daily. 04/20/18   Caccavale, Sophia, PA-C  HYDROcodone -acetaminophen  (NORCO/VICODIN) 5-325 MG tablet Take 1 tablet by mouth every 6 (six) hours as needed. 08/15/24   Patt Alm Macho, MD  lidocaine  (LIDODERM ) 5 % Place 1 patch onto the skin daily. Remove & Discard patch within 12 hours or as directed by MD 06/21/24   Nettie, April, MD  nitrofurantoin , macrocrystal-monohydrate, (MACROBID ) 100 MG capsule Take 1 capsule (100 mg total) by mouth 2 (two) times daily. 08/15/24   Patt Alm Macho, MD  omeprazole  (PRILOSEC) 20 MG capsule Take 1 capsule (20 mg total) by mouth daily. 11/06/15   Palumbo, April, MD  ondansetron  (ZOFRAN ) 4 MG tablet Take 1 tablet (4 mg total) by mouth every 4 (four) hours as needed for nausea or vomiting. 11/30/23   Elnor Jayson A, DO  ondansetron  (ZOFRAN -ODT) 4 MG disintegrating tablet Take 1 tablet (4 mg total) by mouth every 8 (eight) hours as needed for nausea or vomiting. 08/16/24   Dreama Longs, MD  oxybutynin  (DITROPAN  XL) 10 MG 24 hr tablet Take 1 tablet (10 mg total) by mouth at bedtime. 01/27/24   Palumbo, April, MD  promethazine -dextromethorphan (PROMETHAZINE -DM) 6.25-15 MG/5ML syrup Take 5 mLs by mouth 4 (four) times daily as needed for cough. 04/20/18   Caccavale, Sophia, PA-C    Allergies: Clindamycin, Penicillins, Sulfamethoxazole -trimethoprim , and Clindamycin/lincomycin    Review of Systems  Gastrointestinal:  Positive for abdominal pain.  Genitourinary:  Positive for dysuria and flank pain.  All other systems reviewed and are negative.   Updated Vital Signs BP 125/71   Pulse (!) 56   Temp 98.3 F (36.8 C) (Oral)   Resp 18   Ht 5' 2 (1.575 m)   Wt 68 kg   LMP 02/01/2014   SpO2 99%   BMI 27.44 kg/m   Physical Exam Vitals and nursing note reviewed.  Constitutional:      General: She is not in acute distress.    Appearance: She is well-developed. She is not ill-appearing.  HENT:     Head: Normocephalic and atraumatic.  Eyes:      Conjunctiva/sclera: Conjunctivae normal.  Cardiovascular:     Rate and Rhythm: Normal rate and regular rhythm.     Heart sounds: No murmur heard. Pulmonary:     Effort: Pulmonary effort is normal. No respiratory distress.     Breath sounds: Normal breath sounds.  Abdominal:     Palpations: Abdomen is soft.     Tenderness: There is abdominal tenderness in the right lower quadrant. There is right CVA tenderness and guarding. There is no left CVA tenderness.  Musculoskeletal:        General: No swelling.     Cervical back: Neck supple.  Skin:    General: Skin is warm and dry.     Capillary Refill: Capillary refill takes less than 2 seconds.  Neurological:     Mental Status: She is alert.  Psychiatric:        Mood and Affect: Mood normal.     (all labs ordered are listed, but only abnormal results are displayed) Labs Reviewed  CBC - Abnormal; Notable for the following components:      Result Value   WBC 12.9 (*)    All other components within normal limits  URINALYSIS, ROUTINE W REFLEX MICROSCOPIC - Abnormal; Notable for the following components:   Hgb urine dipstick TRACE (*)    Nitrite POSITIVE (*)    Leukocytes,Ua SMALL (*)    All other components within normal limits  URINALYSIS, MICROSCOPIC (REFLEX) - Abnormal; Notable for the following components:   Bacteria, UA MANY (*)    All other components within normal limits  URINE CULTURE  LIPASE, BLOOD  COMPREHENSIVE METABOLIC PANEL WITH GFR    EKG: None  Radiology: CT ABDOMEN PELVIS W CONTRAST Result Date: 08/28/2024 EXAM: CT ABDOMEN AND PELVIS WITH CONTRAST 08/28/2024 08:38:00 PM TECHNIQUE: CT of the abdomen and pelvis was performed with the administration of 80 mL of iohexol  (OMNIPAQUE ) 300 MG/ML solution. Multiplanar reformatted images are provided for review. Automated exposure control, iterative reconstruction, and/or weight-based adjustment of the mA/kV was utilized to reduce the radiation dose to as low as reasonably  achievable. COMPARISON: 08/15/2024 CLINICAL HISTORY: Abdominal/flank pain, stone suspected; RLQ abdominal pain. Urinary tract infection, nausea, dysuria. FINDINGS: LOWER CHEST: No acute abnormality. LIVER: Mild hepatic steatosis. GALLBLADDER AND BILE DUCTS: Status post cholecystectomy. No biliary ductal dilatation. SPLEEN: No acute abnormality. PANCREAS: No acute abnormality. ADRENAL GLANDS: No acute abnormality. KIDNEYS, URETERS AND BLADDER: No stones in the kidneys or ureters. No hydronephrosis. No perinephric or periureteral stranding. Urinary bladder is unremarkable. GI AND BOWEL: Appendectomy. The stomach, small bowel, and large bowel are otherwise unremarkable. There is no bowel obstruction. PERITONEUM AND RETROPERITONEUM: No ascites. No free air. VASCULATURE: Aorta is normal in caliber. LYMPH NODES: No lymphadenopathy. REPRODUCTIVE ORGANS: Uterus absent. Residual ovaries are unremarkable. BONES AND SOFT TISSUES: Bilateral laminectomy and resection of the spinous processes of L2 and L3. Osseous structures  are age appropriate. No acute bone abnormality. No lytic or blastic bone lesion. No focal soft tissue abnormality. IMPRESSION: 1. No acute abdominopelvic findings to explain urinary tract infection symptoms, nausea, or dysuria. 2. Mild hepatic steatosis. 3. Status post cholecystectomy, appendectomy, and hysterectomy . 4. Postsurgical changes of bilateral laminectomy and spinous process resection at L2 and L3. Electronically signed by: Dorethia Molt MD MD 08/28/2024 09:03 PM EST RP Workstation: [id number]     Procedures   Medications Ordered in the ED  morphine  (PF) 4 MG/ML injection 4 mg (4 mg Intravenous Given 08/28/24 2028)  ondansetron  (ZOFRAN ) injection 4 mg (4 mg Intravenous Given 08/28/24 2027)  iohexol  (OMNIPAQUE ) 300 MG/ML solution 80 mL (80 mLs Intravenous Contrast Given 08/28/24 2038)  meropenem  (MERREM ) 1 g in sodium chloride  0.9 % 100 mL IVPB (0 g Intravenous Stopped 08/28/24 2203)   morphine  (PF) 2 MG/ML injection 2 mg (2 mg Intravenous Given 08/28/24 2139)  ketorolac  (TORADOL ) 15 MG/ML injection 15 mg (15 mg Intravenous Given 08/28/24 2230)                                    Medical Decision Making Amount and/or Complexity of Data Reviewed Labs: ordered.   This patient presents to the ED for concern of abdominal pain.  Differential diagnosis includes pancreatitis, appendicitis, pyelonephritis, UTI    Additional history obtained:  Additional history obtained from chart review   Lab Tests:  I Ordered, and personally interpreted labs.  The pertinent results include: CBC with mild leukocytosis of 12.9, UA consistent infection with many bacteria, leukocytes, and nitrites seen.  There is some trace hemoglobin present.  Lipase unremarkable at 29, CMP unremarkable   Imaging Studies ordered:  I ordered imaging studies including CT abdomen pelvis I independently visualized and interpreted imaging which showed no acute findings seen to explain current symptoms I agree with the radiologist interpretation   Medicines ordered and prescription drug management:  I ordered medication including Zofran , morphine , meropenem , Toradol  for nausea, pain, pyelonephritis Reevaluation of the patient after these medicines showed that the patient improved I have reviewed the patients home medicines and have made adjustments as needed   Problem List / ED Course:  Patient with past history significant for asthma, frequent kidney stones presents to the emergency department with concerns of abdominal pain.  Reports symptoms are about 1 week ago and was initially seen Emergency Department started on antibiotics.  Reports she completed antibiotics and had improvement of symptoms but states symptoms of not returned.  Denies any fever, chills or bodyaches. On exam, there is notable right lower quadrant tenderness as well as right CVA tenderness.  No abnormal heart or lung sounds. Workup  shows UA consistent with infection but patient has had routine testing that shows recurrent infection over the last several months.  However, this is a clear infection based on urinalysis alone with bacteria, leukocytes, and nitrites present.  Along patient's symptoms, I am concerned for cystitis versus pyelonephritis.  Imaging is currently pending.  Labs otherwise unremarkable with exception of some mild leukocytosis at 12.9.  Urine culture added on for further testing. CT on pelvis negative for any acute findings.  Patient has mildly controlled pain with morphine .  Meropenem  added on for ESBL coverage.  Added on Toradol  for further pain control. Given recurrent infections and failure of outpatient antibiotics, I am concerned the patient may have worsening progressively resistance.  Will consult hospitalist  for admission due to concerns for failure of outpatient antibiotics. Spoke with Dr. Charlton, hospitalist, who will be admitting patient.   Social Determinants of Health:  None  Final diagnoses:  Pyelonephritis    ED Discharge Orders     None          Priscilla Barajas DELENA DEVONNA 08/28/24 2246    Patt Alm Macho, MD 08/28/24 2253  "

## 2024-08-28 NOTE — ED Notes (Signed)
 Requested transport via [place] at [employer] [facility] 10:50p

## 2024-08-28 NOTE — Progress Notes (Addendum)
 ED Pharmacy Antibiotic Sign Off An antibiotic consult was received from an ED provider for merrem  per pharmacy dosing for ESBL. A chart review was completed to assess appropriateness.   The following one time order(s) were placed:  Merrem  1g  Noted PCN allergy and has tolerated in prior history.  Further antibiotic and/or antibiotic pharmacy consults should be ordered by the admitting provider if indicated.   Thank you for allowing pharmacy to be a part of this patient's care.   Leonor GORMAN Bash, [facility]  Clinical Pharmacist 08/28/2024 9:22 PM

## 2024-08-28 NOTE — Progress Notes (Signed)
 Plan of Care Note for accepted transfer   Patient: Priscilla Barajas MRN: [id number]   DOA: 08/28/2024  Facility requesting transfer: [facility]   Requesting Provider: Legrand Angle, PA   Reason for transfer: UTI, failed outpatient treatment   Facility course: 39 yr old female with depression, anxiety, asthma, and UTIs d/t ESBL-producing E coli who presents with ongoing dysuria and worsening right-sided abdominal pain and nausea despite treatment with Macrobid  and then Keflex .   She is afebrile. WBC is 12.9. There are no acute findings on CT abdomen/pelvis.   She was treated with meropenem  and analgesics.   Plan of care: The patient is accepted for admission to Med-surg  unit, at [facility].   Author: Evalene GORMAN Sprinkles, MD 08/28/2024  Check [email or web address] for on-call coverage.  Nursing staff, Please call [facility] Admits & Consults System-Wide number on Amion as soon as patient's arrival, so appropriate admitting provider can evaluate the pt.

## 2024-08-29 DIAGNOSIS — N3000 Acute cystitis without hematuria: Secondary | ICD-10-CM

## 2024-08-29 DIAGNOSIS — N12 Tubulo-interstitial nephritis, not specified as acute or chronic: Principal | ICD-10-CM | POA: Diagnosis present

## 2024-08-29 LAB — CBC
HCT: 36.3 % (ref 36.0–46.0)
Hemoglobin: 11.7 g/dL — ABNORMAL LOW (ref 12.0–15.0)
MCH: 28.5 pg (ref 26.0–34.0)
MCHC: 32.2 g/dL (ref 30.0–36.0)
MCV: 88.5 fL (ref 80.0–100.0)
Platelets: 248 K/uL (ref 150–400)
RBC: 4.1 MIL/uL (ref 3.87–5.11)
RDW: 13.2 % (ref 11.5–15.5)
WBC: 10.5 K/uL (ref 4.0–10.5)
nRBC: 0 % (ref 0.0–0.2)

## 2024-08-29 LAB — BASIC METABOLIC PANEL WITH GFR
Anion gap: 10 (ref 5–15)
BUN: 13 mg/dL (ref 6–20)
CO2: 26 mmol/L (ref 22–32)
Calcium: 8.9 mg/dL (ref 8.9–10.3)
Chloride: 103 mmol/L (ref 98–111)
Creatinine, Ser: 0.65 mg/dL (ref 0.44–1.00)
GFR, Estimated: >60 mL/min
Glucose, Bld: 89 mg/dL (ref 70–99)
Potassium: 4.2 mmol/L (ref 3.5–5.1)
Sodium: 139 mmol/L (ref 135–145)

## 2024-08-29 MED ORDER — ONDANSETRON HCL 4 MG/2ML IJ SOLN
4.0000 mg | Freq: Four times a day (QID) | INTRAMUSCULAR | Status: DC | PRN
  Administered 2024-08-29 (×2): 4 mg via INTRAVENOUS
  Filled 2024-08-29 (×2): qty 2

## 2024-08-29 MED ORDER — LACTATED RINGERS IV SOLN: INTRAVENOUS | Status: DC

## 2024-08-29 MED ORDER — MORPHINE SULFATE (PF) 4 MG/ML IV SOLN
4.0000 mg | Freq: Once | INTRAVENOUS | Status: AC
  Administered 2024-08-29: 4 mg via INTRAVENOUS
  Filled 2024-08-29: qty 1

## 2024-08-29 MED ORDER — ENOXAPARIN SODIUM 40 MG/0.4ML IJ SOSY
40.0000 mg | PREFILLED_SYRINGE | INTRAMUSCULAR | Status: DC
  Administered 2024-08-29 – 2024-08-30 (×2): 40 mg via SUBCUTANEOUS
  Filled 2024-08-29 (×2): qty 0.4

## 2024-08-29 MED ORDER — ACETAMINOPHEN 650 MG RE SUPP: 650.0000 mg | Freq: Four times a day (QID) | RECTAL | Status: DC | PRN

## 2024-08-29 MED ORDER — FLUCONAZOLE 150 MG PO TABS
150.0000 mg | ORAL_TABLET | Freq: Every day | ORAL | Status: DC
  Administered 2024-08-29: 150 mg via ORAL
  Filled 2024-08-29: qty 1

## 2024-08-29 MED ORDER — SODIUM CHLORIDE 0.9 % IV SOLN
1.0000 g | INTRAVENOUS | Status: DC
  Administered 2024-08-29 – 2024-08-30 (×2): 1 g via INTRAVENOUS
  Filled 2024-08-29 (×2): qty 1000

## 2024-08-29 MED ORDER — ONDANSETRON HCL 4 MG/2ML IJ SOLN: 4.0000 mg | Freq: Once | INTRAMUSCULAR | Status: DC

## 2024-08-29 MED ORDER — BUSPIRONE HCL 5 MG PO TABS
5.0000 mg | ORAL_TABLET | Freq: Two times a day (BID) | ORAL | Status: DC
  Administered 2024-08-29 – 2024-08-30 (×3): 5 mg via ORAL
  Filled 2024-08-29 (×3): qty 1

## 2024-08-29 MED ORDER — PANTOPRAZOLE SODIUM 40 MG PO TBEC
40.0000 mg | DELAYED_RELEASE_TABLET | Freq: Every day | ORAL | Status: DC
  Administered 2024-08-29 – 2024-08-30 (×2): 40 mg via ORAL
  Filled 2024-08-29 (×2): qty 1

## 2024-08-29 MED ORDER — ACETAMINOPHEN 325 MG PO TABS
650.0000 mg | ORAL_TABLET | Freq: Four times a day (QID) | ORAL | Status: DC | PRN
  Administered 2024-08-30: 650 mg via ORAL
  Filled 2024-08-29: qty 2

## 2024-08-29 MED ORDER — ONDANSETRON HCL 4 MG PO TABS: 4.0000 mg | ORAL_TABLET | Freq: Four times a day (QID) | ORAL | Status: DC | PRN

## 2024-08-29 MED ORDER — FENTANYL CITRATE (PF) 50 MCG/ML IJ SOSY
12.5000 ug | PREFILLED_SYRINGE | INTRAMUSCULAR | Status: DC | PRN
  Administered 2024-08-29 – 2024-08-30 (×10): 50 ug via INTRAVENOUS
  Filled 2024-08-29 (×10): qty 1

## 2024-08-29 MED ORDER — OXYBUTYNIN CHLORIDE ER 5 MG PO TB24
10.0000 mg | ORAL_TABLET | Freq: Every day | ORAL | Status: DC
  Administered 2024-08-29: 10 mg via ORAL
  Filled 2024-08-29: qty 2

## 2024-08-29 MED ORDER — SERTRALINE HCL 50 MG PO TABS
50.0000 mg | ORAL_TABLET | Freq: Every day | ORAL | Status: DC
  Administered 2024-08-29 – 2024-08-30 (×2): 50 mg via ORAL
  Filled 2024-08-29 (×2): qty 1

## 2024-08-29 NOTE — Plan of Care (Signed)
   Problem: Activity: Goal: Risk for activity intolerance will decrease Outcome: Progressing   Problem: Coping: Goal: Level of anxiety will decrease Outcome: Progressing   Problem: Pain Managment: Goal: General experience of comfort will improve and/or be controlled Outcome: Progressing

## 2024-08-29 NOTE — Progress Notes (Signed)
 " PROGRESS NOTE    Priscilla Barajas  FMW:[id number] DOB: [birth year] DOA: 08/28/2024 PCP: Pcp, No  Outpatient Specialists:     Brief Narrative:  Patient is a 39 year old female past medical history significant for asthma, nephrolithiasis and UTI secondary to E. coli ESBL.  Patient has been admitted for likely UTI.  UA revealed positive nitrite and many bacteria.  Urine culture result is pending.  Patient is currently on IV ertapenem .  07/08/2025: Patient seen.  Patient continues to report dysuria and abdominal pain.   Assessment & Plan:   Principal Problem:   UTI (urinary tract infection) Active Problems:   Pyelonephritis   Likely UTI: - Follow cultures. - Prior history of UTI secondary to E. coli ESBL in September 2025. - Continue IV ertapenem . -CT abdomen and pelvis with contrast revealed mild hepatic steatosis. - Supportive care.  History of asthma: - Stable.  GERD, - Continue Protonix .  History of nephrolithiasis: - Stable. - CT abdomen and pelvis did not reveal any stones.  Mild steatosis: - Diet and exercise. - Further management as per patient's primary care provider on an outpatient basis.    DVT prophylaxis: Subcutaneous Lovenox . Code Status: Full code. Family Communication:  Disposition Plan:    Consultants:  None.  Procedures:  None.  Antimicrobials:  IV ertapenem    Subjective: -Continues to report dysuria and abdominal pain.  Objective: Vitals:   08/29/24 0823 08/29/24 1108 08/29/24 1137 08/29/24 1440  BP: (!) 106/54 (!) 88/74 111/77 125/70  Pulse: (!) 53 64 (!) 55 (!) 57  Resp: 17 17  17   Temp: 98.2 F (36.8 C) 98.5 F (36.9 C)  98.6 F (37 C)  TempSrc: Oral Oral    SpO2: 98% 98%  98%  Weight:      Height:        Intake/Output Summary (Last 24 hours) at 08/29/2024 1515 Last data filed at 08/29/2024 0945 Gross per 24 hour  Intake 783.91 ml  Output --  Net 783.91 ml   Filed Weights   08/28/24 1916  Weight: 68 kg     Examination:  General exam: Appears calm and comfortable  Respiratory system: Clear to auscultation. Respiratory effort normal. Cardiovascular system: S1 & S2 Gastrointestinal system: Abdomen is soft.  Vague abdominal tenderness suprapubic area, epigastric and right-sided abdominal area.   Central nervous system: Awake and alert.   Extremities: No leg edema.  Data Reviewed: I have personally reviewed following labs and imaging studies  CBC: Recent Labs  Lab 08/28/24 1923 08/29/24 0510  WBC 12.9* 10.5  HGB 12.3 11.7*  HCT 37.3 36.3  MCV 87.4 88.5  PLT 273 248   Basic Metabolic Panel: Recent Labs  Lab 08/28/24 1923 08/29/24 0510  NA 139 139  K 4.2 4.2  CL 102 103  CO2 26 26  GLUCOSE 98 89  BUN 13 13  CREATININE 0.57 0.65  CALCIUM 9.3 8.9   GFR: Estimated Creatinine Clearance: 85.4 mL/min (by C-G formula based on SCr of 0.65 mg/dL). Liver Function Tests: Recent Labs  Lab 08/28/24 1923  AST 23  ALT 26  ALKPHOS 108  BILITOT <0.2  PROT 7.0  ALBUMIN 4.5   Recent Labs  Lab 08/28/24 1923  LIPASE 29   No results for input(s): AMMONIA in the last 168 hours. Coagulation Profile: No results for input(s): INR, PROTIME in the last 168 hours. Cardiac Enzymes: No results for input(s): CKTOTAL, CKMB, CKMBINDEX, TROPONINI in the last 168 hours. BNP (last 3 results) No results for input(s): PROBNP  in the last 8760 hours. HbA1C: No results for input(s): HGBA1C in the last 72 hours. CBG: No results for input(s): GLUCAP in the last 168 hours. Lipid Profile: No results for input(s): CHOL, HDL, LDLCALC, TRIG, CHOLHDL, LDLDIRECT in the last 72 hours. Thyroid Function Tests: No results for input(s): TSH, T4TOTAL, FREET4, T3FREE, THYROIDAB in the last 72 hours. Anemia Panel: No results for input(s): VITAMINB12, FOLATE, FERRITIN, TIBC, IRON, RETICCTPCT in the last 72 hours. Urine analysis:    Component Value  Date/Time   COLORURINE YELLOW 08/28/2024 1919   APPEARANCEUR CLEAR 08/28/2024 1919   LABSPEC 1.015 08/28/2024 1919   PHURINE 6.0 08/28/2024 1919   GLUCOSEU NEGATIVE 08/28/2024 1919   HGBUR TRACE (A) 08/28/2024 1919   BILIRUBINUR NEGATIVE 08/28/2024 1919   KETONESUR NEGATIVE 08/28/2024 1919   PROTEINUR NEGATIVE 08/28/2024 1919   UROBILINOGEN 1.0 04/03/2014 2154   NITRITE POSITIVE (A) 08/28/2024 1919   LEUKOCYTESUR SMALL (A) 08/28/2024 1919   Sepsis Labs: @LABRCNTIP (procalcitonin:4,lacticidven:4)  )No results found for this or any previous visit (from the past 240 hours).       Radiology Studies: CT ABDOMEN PELVIS W CONTRAST Result Date: 08/28/2024 EXAM: CT ABDOMEN AND PELVIS WITH CONTRAST 08/28/2024 08:38:00 PM TECHNIQUE: CT of the abdomen and pelvis was performed with the administration of 80 mL of iohexol  (OMNIPAQUE ) 300 MG/ML solution. Multiplanar reformatted images are provided for review. Automated exposure control, iterative reconstruction, and/or weight-based adjustment of the mA/kV was utilized to reduce the radiation dose to as low as reasonably achievable. COMPARISON: 08/15/2024 CLINICAL HISTORY: Abdominal/flank pain, stone suspected; RLQ abdominal pain. Urinary tract infection, nausea, dysuria. FINDINGS: LOWER CHEST: No acute abnormality. LIVER: Mild hepatic steatosis. GALLBLADDER AND BILE DUCTS: Status post cholecystectomy. No biliary ductal dilatation. SPLEEN: No acute abnormality. PANCREAS: No acute abnormality. ADRENAL GLANDS: No acute abnormality. KIDNEYS, URETERS AND BLADDER: No stones in the kidneys or ureters. No hydronephrosis. No perinephric or periureteral stranding. Urinary bladder is unremarkable. GI AND BOWEL: Appendectomy. The stomach, small bowel, and large bowel are otherwise unremarkable. There is no bowel obstruction. PERITONEUM AND RETROPERITONEUM: No ascites. No free air. VASCULATURE: Aorta is normal in caliber. LYMPH NODES: No lymphadenopathy. REPRODUCTIVE  ORGANS: Uterus absent. Residual ovaries are unremarkable. BONES AND SOFT TISSUES: Bilateral laminectomy and resection of the spinous processes of L2 and L3. Osseous structures are age appropriate. No acute bone abnormality. No lytic or blastic bone lesion. No focal soft tissue abnormality. IMPRESSION: 1. No acute abdominopelvic findings to explain urinary tract infection symptoms, nausea, or dysuria. 2. Mild hepatic steatosis. 3. Status post cholecystectomy, appendectomy, and hysterectomy . 4. Postsurgical changes of bilateral laminectomy and spinous process resection at L2 and L3. Electronically signed by: Dorethia Molt MD MD 08/28/2024 09:03 PM EST RP Workstation: [id number]        Scheduled Meds:  busPIRone   5 mg Oral BID   enoxaparin  (LOVENOX ) injection  40 mg Subcutaneous Q24H   ondansetron  (ZOFRAN ) IV  4 mg Intravenous Once   oxybutynin   10 mg Oral QHS   pantoprazole   40 mg Oral Daily   sertraline   50 mg Oral Daily   Continuous Infusions:  ertapenem  Stopped (08/29/24 0223)   lactated ringers  125 mL/hr at 08/29/24 0956     LOS: 1 day    Time spent: 55 minutes.    Leatrice Chapel, MD  [facility] 7PM-7AM contact night coverage as above    "

## 2024-08-29 NOTE — H&P (Signed)
 " History and Physical    Priscilla Barajas FMW:[id number] DOB: [birth year] DOA: 08/28/2024  PCP: Pcp, No   Chief Complaint: abd pain  HPI: Priscilla Barajas is a 39 y.o. female with medical history significant of asthma, nephrolithiasis who presents emergency department due to abdominal pain.  Patient was seen a week ago for urinary symptoms.  Pain continued to worsen with dysuria.  She is concerned she had recurrent UTI so she presented to the ER.  Of note she finished antibiotics in September for ESBL.  She presented to the emergency department.  Labs were obtained which showed urinalysis concerning for infection, CMP unrevealing, WBC 12.9.  CT abdomen showed no acute findings.  Patient was started on meropenem  and transferred to [facility] for inpatient antibiotics.  Last urine culture showed ESBL E. coli with resistance to most antibiotics except carbapenems.  Patient was placed on ertapenem . Ona dmission she was requiring IV analgesia for abd pain.    Review of Systems: Review of Systems  Constitutional:  Negative for chills and fever.  All other systems reviewed and are negative.    As per HPI otherwise 10 point review of systems negative.   Allergies[1]  Past Medical History:  Diagnosis Date   Asthma    Kidney stones     Past Surgical History:  Procedure Laterality Date   ABDOMINAL HYSTERECTOMY     BACK SURGERY     BLADDER SURGERY       reports that she has been smoking cigarettes. She has never used smokeless tobacco. She reports that she does not drink alcohol and does not use drugs.  History reviewed. No pertinent family history.  Prior to Admission medications  Medication Sig Start Date End Date Taking? Authorizing Provider  acetaminophen  (TYLENOL ) 325 MG tablet Take 2 tablets (650 mg total) by mouth every 6 (six) hours as needed. 11/30/23   Elnor Jayson LABOR, DO  ALBUTEROL  IN Inhale into the lungs.    [provider]  celecoxib  (CELEBREX ) 200 MG  capsule Take 1 capsule (200 mg total) by mouth 2 (two) times daily as needed. 05/11/24   Silver Wonda LABOR, PA  cyclobenzaprine  (FLEXERIL ) 10 MG tablet Take 1 tablet (10 mg total) by mouth 2 (two) times daily as needed for muscle spasms. 05/11/24   Silver Wonda LABOR, PA  fluconazole  (DIFLUCAN ) 150 MG tablet Take 1 tablet (150 mg total) by mouth daily. Following antibiotics 01/27/24   Palumbo, April, MD  fluticasone  (FLONASE ) 50 MCG/ACT nasal spray Place 1 spray into both nostrils daily. 04/20/18   Caccavale, Sophia, PA-C  HYDROcodone -acetaminophen  (NORCO/VICODIN) 5-325 MG tablet Take 1 tablet by mouth every 6 (six) hours as needed. 08/15/24   Patt Alm Macho, MD  lidocaine  (LIDODERM ) 5 % Place 1 patch onto the skin daily. Remove & Discard patch within 12 hours or as directed by MD 06/21/24   Nettie, April, MD  nitrofurantoin , macrocrystal-monohydrate, (MACROBID ) 100 MG capsule Take 1 capsule (100 mg total) by mouth 2 (two) times daily. 08/15/24   Patt Alm Macho, MD  omeprazole  (PRILOSEC) 20 MG capsule Take 1 capsule (20 mg total) by mouth daily. 11/06/15   Palumbo, April, MD  ondansetron  (ZOFRAN ) 4 MG tablet Take 1 tablet (4 mg total) by mouth every 4 (four) hours as needed for nausea or vomiting. 11/30/23   Elnor Jayson A, DO  ondansetron  (ZOFRAN -ODT) 4 MG disintegrating tablet Take 1 tablet (4 mg total) by mouth every 8 (eight) hours as needed for nausea or vomiting. 08/16/24  Dreama Longs, MD  oxybutynin  (DITROPAN  XL) 10 MG 24 hr tablet Take 1 tablet (10 mg total) by mouth at bedtime. 01/27/24   Palumbo, April, MD  promethazine -dextromethorphan (PROMETHAZINE -DM) 6.25-15 MG/5ML syrup Take 5 mLs by mouth 4 (four) times daily as needed for cough. 04/20/18   Denette Mclean, PA-C    Physical Exam: Vitals:   08/28/24 2230 08/28/24 2245 08/28/24 2316 08/28/24 2358  BP: 132/77 117/88 121/83 114/73  Pulse: 62 (!) 54 66 (!) 56  Resp: 18  19 18   Temp:   97.9 F (36.6 C) 98.1 F (36.7 C)  TempSrc:    Oral Oral  SpO2: 100% 97% 99% 98%  Weight:      Height:       Physical Exam Constitutional:      Appearance: She is normal weight.  HENT:     Head: Normocephalic.     Mouth/Throat:     Mouth: Mucous membranes are moist.     Pharynx: Oropharynx is clear.  Cardiovascular:     Rate and Rhythm: Normal rate and regular rhythm.  Pulmonary:     Effort: Pulmonary effort is normal.  Abdominal:     General: Abdomen is flat.     Palpations: Abdomen is soft.  Skin:    General: Skin is warm.     Capillary Refill: Capillary refill takes less than 2 seconds.  Neurological:     General: No focal deficit present.     Mental Status: She is alert.      Labs on Admission: I have personally reviewed the patients's labs and imaging studies.  Assessment/Plan Principal Problem:   UTI (urinary tract infection) Active Problems:   Pyelonephritis   # Acute cystitis - priorUrinary cultures show ESBL - Leukocytosis and urinalysis concerning for infection  Plan: Continue ertapenem  Continue prophylactic fluconazole  Continue IV fluids Continue home oxybutynin   # GERD-continue Protonix   Admission status: Inpatient Med-Surg  Certification: The appropriate patient status for this patient is INPATIENT. Inpatient status is judged to be reasonable and necessary in order to provide the required intensity of service to ensure the patient's safety. The patient's presenting symptoms, physical exam findings, and initial radiographic and laboratory data in the context of their chronic comorbidities is felt to place them at high risk for further clinical deterioration. Furthermore, it is not anticipated that the patient will be medically stable for discharge from the hospital within 2 midnights of admission.   * I certify that at the point of admission it is my clinical judgment that the patient will require inpatient hospital care spanning beyond 2 midnights from the point of admission due to high  intensity of service, high risk for further deterioration and high frequency of surveillance required.DEWAINE Lamar Dess MD [facility] If 7PM-7AM, please contact night-coverage [email or web address]  08/29/2024, 1:33 AM        [1]  Allergies Allergen Reactions   Clindamycin Hives and Other (See Comments)    Felt like throat closing   Penicillins Dermatitis    hives  Pt reports this only gives her a yeast infection. Pt has recently taken a dose with no reactions noted.  hives    Pt reports this only gives her a yeast infection. Pt has recently taken a dose with no reactions noted.   Sulfamethoxazole -Trimethoprim  Dermatitis    Patient states that she is tolerated this several times and can take this medicine   Clindamycin/Lincomycin    "

## 2024-08-30 DIAGNOSIS — N390 Urinary tract infection, site not specified: Secondary | ICD-10-CM

## 2024-08-30 MED ORDER — DIPHENHYDRAMINE HCL 25 MG PO CAPS
25.0000 mg | ORAL_CAPSULE | Freq: Three times a day (TID) | ORAL | Status: DC | PRN
  Administered 2024-08-30: 25 mg via ORAL
  Filled 2024-08-30: qty 1

## 2024-08-30 MED ORDER — DIPHENHYDRAMINE HCL 25 MG PO CAPS: 25.0000 mg | ORAL_CAPSULE | Freq: Four times a day (QID) | ORAL | Status: DC | PRN

## 2024-08-30 MED ORDER — IBUPROFEN 400 MG PO TABS: 400.0000 mg | ORAL_TABLET | ORAL | Status: DC | PRN

## 2024-08-30 NOTE — Progress Notes (Signed)
 " PROGRESS NOTE    Priscilla Barajas  FMW:[id number] DOB: 06/23/1986 DOA: 08/28/2024 PCP: Pcp, No  Outpatient Specialists:     Brief Narrative:  Patient is a 39 year old female past medical history significant for asthma, nephrolithiasis and UTI secondary to E. coli ESBL.  Patient has been admitted for likely UTI.  UA revealed positive nitrite and many bacteria.  Urine culture result is pending.  Patient is currently on IV ertapenem .  08/29/2024: Patient seen.  Patient continues to report dysuria and abdominal pain. 08/30/2024: Patient seen alongside patient's nurse.  Urine culture is growing E. coli, but not finalized.  Continue IV antibiotics for now.  Follow final urine cultures.   Assessment & Plan:   Principal Problem:   UTI (urinary tract infection) Active Problems:   Pyelonephritis   Likely UTI: - Preliminary urine culture result is growing E. coli. - Follow final culture results.  - Prior history of UTI secondary to E. coli ESBL in September 2025. - Continue IV ertapenem . -CT abdomen and pelvis with contrast revealed mild hepatic steatosis. - Supportive care.  History of asthma: - Stable.  GERD, - Continue Protonix .  History of nephrolithiasis: - Stable. - CT abdomen and pelvis did not reveal any stones.  Mild steatosis: - Diet and exercise. - Further management as per patient's primary care provider on an outpatient basis.    DVT prophylaxis: Subcutaneous Lovenox . Code Status: Full code. Family Communication:  Disposition Plan:    Consultants:  None.  Procedures:  None.  Antimicrobials:  IV ertapenem    Subjective: - Patient is slowly improving. - No new complaints.  Objective: Vitals:   08/29/24 1440 08/29/24 2124 08/30/24 0533 08/30/24 1440  BP: 125/70 115/69 112/64 120/72  Pulse: (!) 57 (!) 58 (!) 58 (!) 49  Resp: 17 17 18 16   Temp: 98.6 F (37 C) 98.7 F (37.1 C) 97.9 F (36.6 C)   TempSrc:   Oral   SpO2: 98% 98% 98% 98%   Weight:      Height:        Intake/Output Summary (Last 24 hours) at 08/30/2024 2034 Last data filed at 08/30/2024 1400 Gross per 24 hour  Intake 3234.09 ml  Output --  Net 3234.09 ml   Filed Weights   08/28/24 1916  Weight: 68 kg    Examination:  General exam: Appears calm and comfortable  Respiratory system: Clear to auscultation. Respiratory effort normal. Cardiovascular system: S1 & S2 Gastrointestinal system: Abdomen is soft.  Vague abdominal tenderness suprapubic area, epigastric and right-sided abdominal area.   Central nervous system: Awake and alert.   Extremities: No leg edema.  Data Reviewed: I have personally reviewed following labs and imaging studies  CBC: Recent Labs  Lab 08/28/24 1923 08/29/24 0510  WBC 12.9* 10.5  HGB 12.3 11.7*  HCT 37.3 36.3  MCV 87.4 88.5  PLT 273 248   Basic Metabolic Panel: Recent Labs  Lab 08/28/24 1923 08/29/24 0510  NA 139 139  K 4.2 4.2  CL 102 103  CO2 26 26  GLUCOSE 98 89  BUN 13 13  CREATININE 0.57 0.65  CALCIUM 9.3 8.9   GFR: Estimated Creatinine Clearance: 85.4 mL/min (by C-G formula based on SCr of 0.65 mg/dL). Liver Function Tests: Recent Labs  Lab 08/28/24 1923  AST 23  ALT 26  ALKPHOS 108  BILITOT <0.2  PROT 7.0  ALBUMIN 4.5   Recent Labs  Lab 08/28/24 1923  LIPASE 29   No results for input(s): AMMONIA in the  last 168 hours. Coagulation Profile: No results for input(s): INR, PROTIME in the last 168 hours. Cardiac Enzymes: No results for input(s): CKTOTAL, CKMB, CKMBINDEX, TROPONINI in the last 168 hours. BNP (last 3 results) No results for input(s): PROBNP in the last 8760 hours. HbA1C: No results for input(s): HGBA1C in the last 72 hours. CBG: No results for input(s): GLUCAP in the last 168 hours. Lipid Profile: No results for input(s): CHOL, HDL, LDLCALC, TRIG, CHOLHDL, LDLDIRECT in the last 72 hours. Thyroid Function Tests: No results for input(s):  TSH, T4TOTAL, FREET4, T3FREE, THYROIDAB in the last 72 hours. Anemia Panel: No results for input(s): VITAMINB12, FOLATE, FERRITIN, TIBC, IRON, RETICCTPCT in the last 72 hours. Urine analysis:    Component Value Date/Time   COLORURINE YELLOW 08/28/2024 1919   APPEARANCEUR CLEAR 08/28/2024 1919   LABSPEC 1.015 08/28/2024 1919   PHURINE 6.0 08/28/2024 1919   GLUCOSEU NEGATIVE 08/28/2024 1919   HGBUR TRACE (A) 08/28/2024 1919   BILIRUBINUR NEGATIVE 08/28/2024 1919   KETONESUR NEGATIVE 08/28/2024 1919   PROTEINUR NEGATIVE 08/28/2024 1919   UROBILINOGEN 1.0 04/03/2014 2154   NITRITE POSITIVE (A) 08/28/2024 1919   LEUKOCYTESUR SMALL (A) 08/28/2024 1919   Sepsis Labs: @LABRCNTIP (procalcitonin:4,lacticidven:4)  ) Recent Results (from the past 240 hours)  Urine Culture     Status: Abnormal (Preliminary result)   Collection Time: 08/28/24  7:19 PM   Specimen: Urine, Clean Catch  Result Value Ref Range Status   Specimen Description   Final    URINE, CLEAN CATCH Performed at [facility], [address] [place], [place] [address]    Special Requests   Final    Normal Performed at [facility], [address] [place], [place] [address]    Culture (A)  Final    >=100,000 COLONIES/mL ESCHERICHIA COLI SUSCEPTIBILITIES TO FOLLOW Performed at [facility], [address] [place], [place] [address]    Report Status PENDING  Incomplete         Radiology Studies: CT ABDOMEN PELVIS W CONTRAST Result Date: 08/28/2024 EXAM: CT ABDOMEN AND PELVIS WITH CONTRAST 08/28/2024 08:38:00 PM TECHNIQUE: CT of the abdomen and pelvis was performed with the administration of 80 mL of iohexol  (OMNIPAQUE ) 300 MG/ML solution. Multiplanar reformatted images are provided for review. Automated exposure control, iterative reconstruction, and/or weight-based adjustment of the mA/kV was utilized to reduce the radiation dose to as low as reasonably achievable.  COMPARISON: 08/15/2024 CLINICAL HISTORY: Abdominal/flank pain, stone suspected; RLQ abdominal pain. Urinary tract infection, nausea, dysuria. FINDINGS: LOWER CHEST: No acute abnormality. LIVER: Mild hepatic steatosis. GALLBLADDER AND BILE DUCTS: Status post cholecystectomy. No biliary ductal dilatation. SPLEEN: No acute abnormality. PANCREAS: No acute abnormality. ADRENAL GLANDS: No acute abnormality. KIDNEYS, URETERS AND BLADDER: No stones in the kidneys or ureters. No hydronephrosis. No perinephric or periureteral stranding. Urinary bladder is unremarkable. GI AND BOWEL: Appendectomy. The stomach, small bowel, and large bowel are otherwise unremarkable. There is no bowel obstruction. PERITONEUM AND RETROPERITONEUM: No ascites. No free air. VASCULATURE: Aorta is normal in caliber. LYMPH NODES: No lymphadenopathy. REPRODUCTIVE ORGANS: Uterus absent. Residual ovaries are unremarkable. BONES AND SOFT TISSUES: Bilateral laminectomy and resection of the spinous processes of L2 and L3. Osseous structures are age appropriate. No acute bone abnormality. No lytic or blastic bone lesion. No focal soft tissue abnormality. IMPRESSION: 1. No acute abdominopelvic findings to explain urinary tract infection symptoms, nausea, or dysuria. 2. Mild hepatic steatosis. 3. Status post cholecystectomy, appendectomy, and hysterectomy . 4.  Postsurgical changes of bilateral laminectomy and spinous process resection at L2 and L3. Electronically signed by: Dorethia Molt MD MD 08/28/2024 09:03 PM EST RP Workstation: [id number]        Scheduled Meds:  busPIRone   5 mg Oral BID   enoxaparin  (LOVENOX ) injection  40 mg Subcutaneous Q24H   oxybutynin   10 mg Oral QHS   pantoprazole   40 mg Oral Daily   sertraline   50 mg Oral Daily   Continuous Infusions:  ertapenem  1 g (08/30/24 0205)   lactated ringers  125 mL/hr at 08/30/24 1145     LOS: 2 days    Time spent: 35 minutes.    Leatrice Chapel, MD  [facility] 7PM-7AM  contact night coverage as above    "

## 2024-08-30 NOTE — Plan of Care (Signed)

## 2024-08-30 NOTE — Progress Notes (Signed)
 Patient called out saying I want to go home RN told patient MD wanted to keep her until tomorrow to wait for culture results and advised against leaving. Patient refused to stay and signed AMA paper

## 2024-08-31 LAB — URINE CULTURE
Culture: >=100000 — AB
Special Requests: NORMAL

## 2024-09-08 ENCOUNTER — Other Ambulatory Visit: Payer: Self-pay

## 2024-09-08 ENCOUNTER — Emergency Department (HOSPITAL_BASED_OUTPATIENT_CLINIC_OR_DEPARTMENT_OTHER)
Admission: EM | Admit: 2024-09-08 | Discharge: 2024-09-08 | Discharge status: Against Medical Advice | Attending: Emergency Medicine | Admitting: Emergency Medicine

## 2024-09-08 ENCOUNTER — Encounter (HOSPITAL_BASED_OUTPATIENT_CLINIC_OR_DEPARTMENT_OTHER): Payer: Self-pay | Admitting: Emergency Medicine

## 2024-09-08 DIAGNOSIS — L509 Urticaria, unspecified: Secondary | ICD-10-CM | POA: Insufficient documentation

## 2024-09-08 DIAGNOSIS — Z5321 Procedure and treatment not carried out due to patient leaving prior to being seen by health care provider: Secondary | ICD-10-CM | POA: Diagnosis not present

## 2024-09-08 NOTE — ED Triage Notes (Signed)
 Pt c/o urticaria to L shoulder and L flank, worsening in size throughout day.   Denies medication changes, products.
